# Patient Record
Sex: Female | Born: 1980 | Race: Black or African American | Hispanic: No | Marital: Single | Smoking: Former smoker
Health system: Southern US, Community
[De-identification: ages and names within clinical notes are randomized; demographics above are authoritative.]

## PROBLEM LIST (undated history)

## (undated) DIAGNOSIS — Z5189 Encounter for other specified aftercare: Secondary | ICD-10-CM

## (undated) DIAGNOSIS — R519 Headache, unspecified: Secondary | ICD-10-CM

## (undated) DIAGNOSIS — I10 Essential (primary) hypertension: Secondary | ICD-10-CM

## (undated) DIAGNOSIS — I639 Cerebral infarction, unspecified: Secondary | ICD-10-CM

## (undated) HISTORY — DX: Headache, unspecified: R51.9

## (undated) HISTORY — DX: Encounter for other specified aftercare: Z51.89

## (undated) HISTORY — DX: Essential (primary) hypertension: I10

## (undated) HISTORY — DX: Cerebral infarction, unspecified: I63.9

---

## 2006-11-06 ENCOUNTER — Inpatient Hospital Stay (HOSPITAL_COMMUNITY): Admission: AD | Admit: 2006-11-06 | Discharge: 2006-11-06 | Payer: Self-pay | Admitting: Obstetrics & Gynecology

## 2006-12-23 ENCOUNTER — Ambulatory Visit (HOSPITAL_COMMUNITY): Admission: RE | Admit: 2006-12-23 | Discharge: 2006-12-23 | Payer: Self-pay | Admitting: Obstetrics

## 2007-03-21 ENCOUNTER — Inpatient Hospital Stay (HOSPITAL_COMMUNITY): Admission: AD | Admit: 2007-03-21 | Discharge: 2007-03-21 | Payer: Self-pay | Admitting: Obstetrics & Gynecology

## 2007-03-30 ENCOUNTER — Inpatient Hospital Stay (HOSPITAL_COMMUNITY): Admission: AD | Admit: 2007-03-30 | Discharge: 2007-03-30 | Payer: Self-pay | Admitting: Obstetrics & Gynecology

## 2007-04-03 ENCOUNTER — Inpatient Hospital Stay (HOSPITAL_COMMUNITY): Admission: AD | Admit: 2007-04-03 | Discharge: 2007-04-03 | Payer: Self-pay | Admitting: Obstetrics

## 2007-04-04 ENCOUNTER — Inpatient Hospital Stay (HOSPITAL_COMMUNITY): Admission: AD | Admit: 2007-04-04 | Discharge: 2007-04-07 | Payer: Self-pay | Admitting: Obstetrics

## 2009-05-13 ENCOUNTER — Emergency Department (HOSPITAL_COMMUNITY): Admission: EM | Admit: 2009-05-13 | Discharge: 2009-05-13 | Payer: Self-pay | Admitting: Emergency Medicine

## 2009-05-17 ENCOUNTER — Inpatient Hospital Stay (HOSPITAL_COMMUNITY): Admission: AD | Admit: 2009-05-17 | Discharge: 2009-05-18 | Payer: Self-pay | Admitting: Obstetrics & Gynecology

## 2010-04-29 ENCOUNTER — Ambulatory Visit (HOSPITAL_COMMUNITY): Admission: RE | Admit: 2010-04-29 | Discharge: 2010-04-29 | Payer: Self-pay | Admitting: Obstetrics

## 2010-06-06 ENCOUNTER — Inpatient Hospital Stay (HOSPITAL_COMMUNITY): Admission: AD | Admit: 2010-06-06 | Discharge: 2010-06-06 | Payer: Self-pay | Admitting: Obstetrics & Gynecology

## 2010-06-19 ENCOUNTER — Ambulatory Visit: Payer: Self-pay | Admitting: Nurse Practitioner

## 2010-06-19 ENCOUNTER — Inpatient Hospital Stay (HOSPITAL_COMMUNITY): Admission: AD | Admit: 2010-06-19 | Discharge: 2010-06-22 | Payer: Self-pay | Admitting: Obstetrics & Gynecology

## 2010-06-26 ENCOUNTER — Inpatient Hospital Stay (HOSPITAL_COMMUNITY): Admission: AD | Admit: 2010-06-26 | Discharge: 2010-06-29 | Payer: Self-pay | Admitting: Obstetrics & Gynecology

## 2010-06-27 ENCOUNTER — Encounter: Payer: Self-pay | Admitting: Obstetrics & Gynecology

## 2010-08-08 ENCOUNTER — Inpatient Hospital Stay (HOSPITAL_COMMUNITY): Admission: AD | Admit: 2010-08-08 | Discharge: 2010-08-09 | Payer: Self-pay | Admitting: Obstetrics

## 2010-09-02 ENCOUNTER — Ambulatory Visit: Payer: Self-pay | Admitting: Family Medicine

## 2010-09-02 ENCOUNTER — Ambulatory Visit (HOSPITAL_COMMUNITY)
Admission: RE | Admit: 2010-09-02 | Discharge: 2010-09-02 | Payer: Self-pay | Source: Home / Self Care | Admitting: Family Medicine

## 2010-09-02 DIAGNOSIS — I10 Essential (primary) hypertension: Secondary | ICD-10-CM | POA: Insufficient documentation

## 2010-09-18 ENCOUNTER — Ambulatory Visit: Payer: Self-pay

## 2010-10-24 NOTE — Assessment & Plan Note (Signed)
Summary: np/referred from womens/eo   Vital Signs:  Patient profile:   30 year old female Weight:      163 pounds Temp:     98.9 degrees F oral Pulse rate:   80 / minute Pulse rhythm:   regular BP sitting:   145 / 89  (left arm) Cuff size:   regular  Vitals Entered By: Loralee Pacas CMA (September 02, 2010 2:16 PM)       History of Present Illness: this is a new pt with hypertension presenting to establish care.    she states she has had intermittent elevated blood pressure since the delivery of her first child in 2008.  since that time she has had intermittent hypertension and most notable during this most recent pregnancy in Oct 2011.  she is currently without any acute complaints today and is compliant with her medication.  she denies any headches, chest pain, difficulty breathing, edema, or any other symptoms.   pt is currently bottle feeding and using depo provera for contraception.   Past History:  Past Medical History: hypertension [id number] TSVD 2008 female, 6lbs 5 oz, +preeclampsia PTSVD at 36 wk due to PTL/PPROM, female, 6 lbs 12 oz  Family History: mom-hypertension, cholesterol, arthritis MGM-breast cancer  Social History: Single Former Smoker Drug use-no social drinker Smoking Status:  quit Drug Use:  no  Review of Systems  The patient denies anorexia, fever, weight loss, weight gain, vision loss, decreased hearing, hoarseness, chest pain, syncope, dyspnea on exertion, peripheral edema, prolonged cough, headaches, hemoptysis, abdominal pain, melena, hematochezia, severe indigestion/heartburn, hematuria, incontinence, genital sores, muscle weakness, suspicious skin lesions, transient blindness, difficulty walking, depression, unusual weight change, abnormal bleeding, enlarged lymph nodes, angioedema, breast masses, and testicular masses.    Physical Exam  General:  Well-developed,well-nourished,in no acute distress; alert,appropriate and cooperative  throughout examination Head:  Normocephalic and atraumatic without obvious abnormalities. No apparent alopecia or balding. Eyes:  No corneal or conjunctival inflammation noted. EOMI. Perrla. Funduscopic exam benign, without hemorrhages, exudates or papilledema. Vision grossly normal. Ears:  External ear exam shows no significant lesions or deformities.  Otoscopic examination reveals clear canals, tympanic membranes are intact bilaterally without bulging, retraction, inflammation or discharge. Hearing is grossly normal bilaterally. Mouth:  Oral mucosa and oropharynx without lesions or exudates.  Teeth in good repair. Neck:  No deformities, masses, or tenderness noted. Lungs:  Normal respiratory effort, chest expands symmetrically. Lungs are clear to auscultation, no crackles or wheezes. Heart:  Normal rate and regular rhythm. S1 and S2 normal without gallop, murmur, click, rub or other extra sounds. Abdomen:  Bowel sounds positive,abdomen soft and non-tender without masses, organomegaly or hernias noted. Pulses:  R and L carotid,radial,femoral,dorsalis pedis and posterior tibial pulses are full and equal bilaterally Extremities:  No clubbing, cyanosis, edema, or deformity noted with normal full range of motion of all joints.   Neurologic:  No cranial nerve deficits noted. Station and gait are normal. Plantar reflexes are down-going bilaterally. DTRs are symmetrical throughout. Sensory, motor and coordinative functions appear intact.   Impression & Recommendations:  Problem # 1:  ESSENTIAL HYPERTENSION, BENIGN (ICD-401.1) Assessment Unchanged  Her updated medication list for this problem includes:    Labetalol Hcl 300 Mg Tabs (Labetalol hcl) ..... One tab by mouth two times a day for hypertension    Hydrochlorothiazide 25 Mg Tabs (Hydrochlorothiazide) ..... One tab by mouth daily for hypertension    Norvasc 10 Mg Tabs (Amlodipine besylate) ..... One tab by mouth daily for hypertension  Future  Orders: UA Microalbumin-[facility] ([id number]) ... 09/04/2010 Comp Met-[facility] ([id number]) ... 09/04/2010 Lipid-[facility] ([id number]) ... 09/04/2010 TSH  initially pt on norvasc 5.  increased norvac to 10 mg daily.  check EKG today and is wnl.  awaiting fasting labs.  will have pt to return in 2 weeks for BP check.   TSH-[facility] [phone]) ... 09/04/2010  Complete Medication List: 1)  Labetalol Hcl 300 Mg Tabs (Labetalol hcl) .... One tab by mouth two times a day for hypertension 2)  Hydrochlorothiazide 25 Mg Tabs (Hydrochlorothiazide) .... One tab by mouth daily for hypertension 3)  Norvasc 10 Mg Tabs (Amlodipine besylate) .... One tab by mouth daily for hypertension  Patient Instructions: 1)  It was a pleasure to care for you today.  2)  Please schedule a follow-up appointment in 2 weeks. 3)  please obtain fasting lab work prior to next appointment.  4)  go to the ED if with chest pain, difficulty breathing, worsening headache, or any other concerning symptom.  Prescriptions: NORVASC 10 MG TABS (AMLODIPINE BESYLATE) one tab by mouth daily for hypertension  #30 x 0   Entered and Authorized by:   Maryelizabeth Kaufmann MD   Signed by:   Maryelizabeth Kaufmann MD on 09/02/2010   Method used:   Print then Give to Patient   RxID:   [id number]    Orders Added: 1)  UA Microalbumin-[facility] [[id number]] 2)  Comp Met-[facility] [[id number]] 3)  Lipid-[facility] [[id number]] 4)  TSH-[facility] [[id number]] 5)  [facility] [[id number]]

## 2010-12-03 LAB — COMPREHENSIVE METABOLIC PANEL
ALT: 16 U/L (ref 0–35)
AST: 17 U/L (ref 0–37)
Albumin: 4.1 g/dL (ref 3.5–5.2)
Alkaline Phosphatase: 64 U/L (ref 39–117)
CO2: 26 mEq/L (ref 19–32)
Chloride: 106 mEq/L (ref 96–112)
GFR calc Af Amer: >60 mL/min (ref >60)
GFR calc non Af Amer: >60 mL/min (ref >60)
Glucose, Bld: 86 mg/dL (ref 70–99)
Potassium: 4.1 mEq/L (ref 3.5–5.1)
Total Protein: 7.4 g/dL (ref 6.0–8.3)

## 2010-12-03 LAB — URIC ACID: Uric Acid, Serum: 5.3 mg/dL (ref 2.4–7.0)

## 2010-12-03 LAB — CBC
HCT: 37.5 % (ref 36.0–46.0)
Hemoglobin: 12.7 g/dL (ref 12.0–15.0)
WBC: 5.1 10*3/uL (ref 4.0–10.5)

## 2010-12-03 LAB — DIFFERENTIAL
Eosinophils Absolute: 0.1 10*3/uL (ref 0.0–0.7)
Eosinophils Relative: 2 % (ref 0–5)
Lymphocytes Relative: 40 % (ref 12–46)
Monocytes Absolute: 0.4 10*3/uL (ref 0.1–1.0)
Neutro Abs: 2.5 10*3/uL (ref 1.7–7.7)
Neutrophils Relative %: 49 % (ref 43–77)

## 2010-12-05 LAB — CBC
HCT: 29.2 % — ABNORMAL LOW (ref 36.0–46.0)
Hemoglobin: 10 g/dL — ABNORMAL LOW (ref 12.0–15.0)
Hemoglobin: 10.5 g/dL — ABNORMAL LOW (ref 12.0–15.0)
Hemoglobin: 10.6 g/dL — ABNORMAL LOW (ref 12.0–15.0)
MCH: 32.7 pg (ref 26.0–34.0)
MCH: 32.8 pg (ref 26.0–34.0)
MCHC: 33.9 g/dL (ref 30.0–36.0)
MCHC: 34.2 g/dL (ref 30.0–36.0)
MCV: 96.7 fL (ref 78.0–100.0)
Platelets: 244 10*3/uL (ref 150–400)
Platelets: 273 10*3/uL (ref 150–400)
RDW: 13.2 % (ref 11.5–15.5)
WBC: 6.8 10*3/uL (ref 4.0–10.5)
WBC: 8.4 10*3/uL (ref 4.0–10.5)

## 2010-12-05 LAB — CREATININE CLEARANCE, URINE, 24 HOUR
Creatinine Clearance: 170 mL/min — ABNORMAL HIGH (ref 75–115)
Creatinine, Urine: 46.4 mg/dL
Creatinine: 0.55 mg/dL (ref 0.4–1.2)

## 2010-12-05 LAB — COMPREHENSIVE METABOLIC PANEL
AST: 23 U/L (ref 0–37)
Albumin: 2.8 g/dL — ABNORMAL LOW (ref 3.5–5.2)
Albumin: 3 g/dL — ABNORMAL LOW (ref 3.5–5.2)
Alkaline Phosphatase: 143 U/L — ABNORMAL HIGH (ref 39–117)
BUN: 3 mg/dL — ABNORMAL LOW (ref 6–23)
CO2: 24 mEq/L (ref 19–32)
Calcium: 9.6 mg/dL (ref 8.4–10.5)
Chloride: 106 mEq/L (ref 96–112)
Creatinine, Ser: 0.55 mg/dL (ref 0.4–1.2)
GFR calc non Af Amer: >60 mL/min (ref >60)
Glucose, Bld: 83 mg/dL (ref 70–99)
Glucose, Bld: 93 mg/dL (ref 70–99)
Potassium: 4.6 mEq/L (ref 3.5–5.1)
Sodium: 135 mEq/L (ref 135–145)
Total Bilirubin: 0.5 mg/dL (ref 0.3–1.2)
Total Bilirubin: 0.6 mg/dL (ref 0.3–1.2)

## 2010-12-05 LAB — URIC ACID
Uric Acid, Serum: 4.8 mg/dL (ref 2.4–7.0)
Uric Acid, Serum: 5.1 mg/dL (ref 2.4–7.0)

## 2010-12-05 LAB — URINALYSIS, ROUTINE W REFLEX MICROSCOPIC
Bilirubin Urine: NEGATIVE
Nitrite: NEGATIVE
Protein, ur: NEGATIVE mg/dL

## 2010-12-05 LAB — STREP B DNA PROBE: Strep Group B Ag: NEGATIVE

## 2010-12-05 LAB — URINE MICROSCOPIC-ADD ON

## 2010-12-05 LAB — PROTEIN, URINE, 24 HOUR: Urine Total Volume-UPROT: 2900 mL

## 2010-12-28 LAB — COMPREHENSIVE METABOLIC PANEL
ALT: 14 U/L (ref 0–35)
AST: 18 U/L (ref 0–37)
Calcium: 9.9 mg/dL (ref 8.4–10.5)
GFR calc Af Amer: >60 mL/min (ref >60)
Glucose, Bld: 88 mg/dL (ref 70–99)
Sodium: 136 mEq/L (ref 135–145)
Total Protein: 7.6 g/dL (ref 6.0–8.3)

## 2010-12-28 LAB — URINE MICROSCOPIC-ADD ON: RBC / HPF: NONE SEEN RBC/hpf (ref <3)

## 2010-12-28 LAB — WET PREP, GENITAL
Clue Cells Wet Prep HPF POC: NONE SEEN
Trich, Wet Prep: NONE SEEN

## 2010-12-28 LAB — URINALYSIS, ROUTINE W REFLEX MICROSCOPIC
Bilirubin Urine: NEGATIVE
Glucose, UA: NEGATIVE mg/dL
Ketones, ur: NEGATIVE mg/dL
pH: 6 (ref 5.0–8.0)

## 2010-12-28 LAB — CBC
MCHC: 33.3 g/dL (ref 30.0–36.0)
MCV: 94.8 fL (ref 78.0–100.0)
RBC: 3.73 MIL/uL — ABNORMAL LOW (ref 3.87–5.11)

## 2010-12-28 LAB — POCT PREGNANCY, URINE: Preg Test, Ur: POSITIVE

## 2010-12-28 LAB — LACTATE DEHYDROGENASE: LDH: 101 U/L (ref 94–250)

## 2010-12-28 LAB — GC/CHLAMYDIA PROBE AMP, GENITAL: Chlamydia, DNA Probe: POSITIVE — AB

## 2011-04-15 IMAGING — US US OB COMP LESS 14 WK
1 series · 14 of 25 positions shown · non-contrast
Comparison: None relevant.

Addendum Begins

Results were called to the maternity admissions unit at the time of
interpretation.
Addendum Ends
CLINICAL DATA: First trimester pregnancy.  Pelvic pain.  LMP
05/17/2009.  Quantitative beta HCG level unknown.
OBSTETRIC <14 WK ULTRASOUND
TECHNIQUE: Transabdominal ultrasound was performed for evaluation
of the gestation as well as the maternal uterus and adnexal
regions.

[Series 1: us ob comp less 14 wks · 0.19mm/px · 25 acquisitions, 14 frames shown]
[im 1/25]
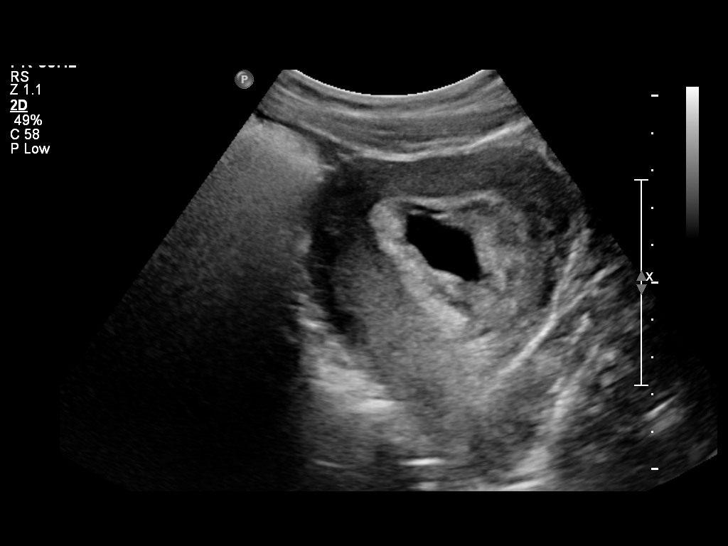
[im 3/25]
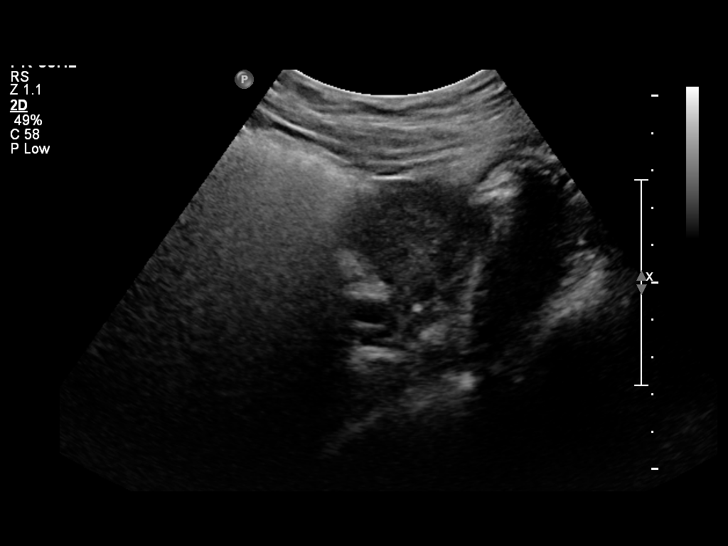
[im 5/25]
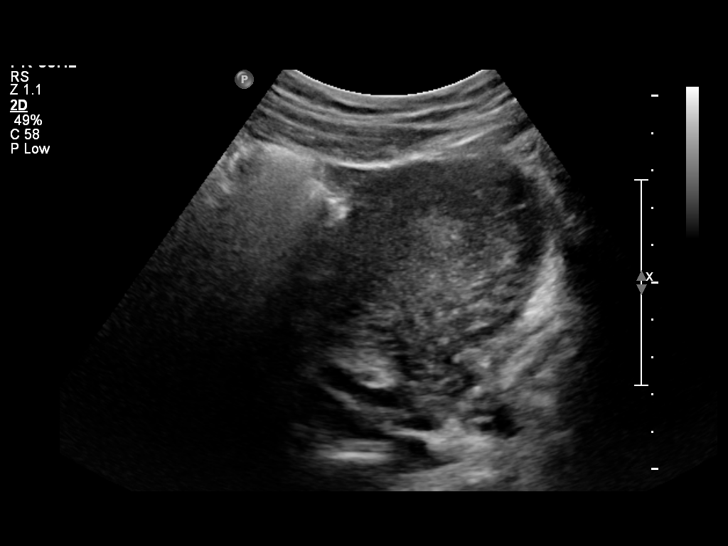
[im 7/25]
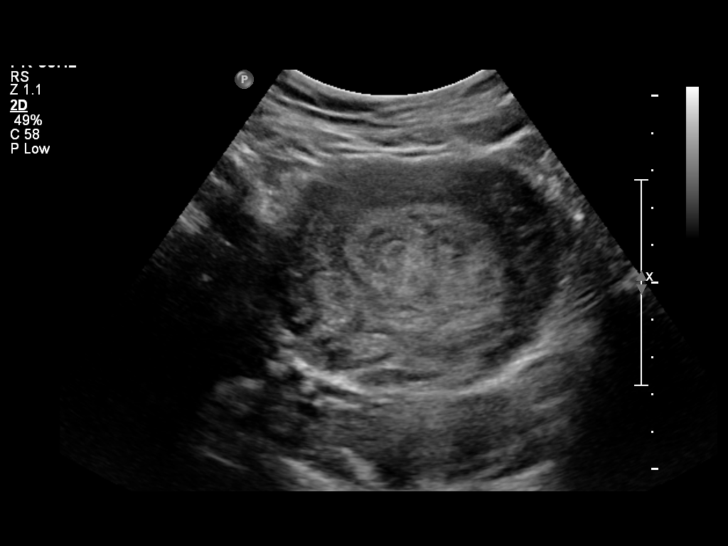
[im 9/25]
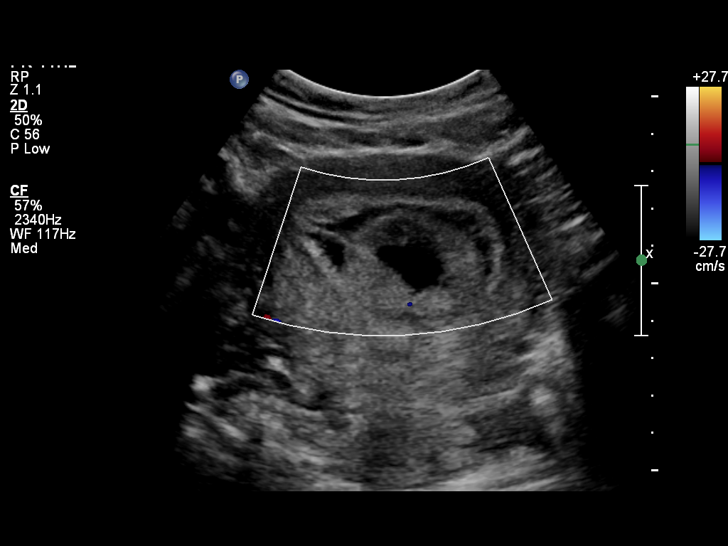
[im 10/25]
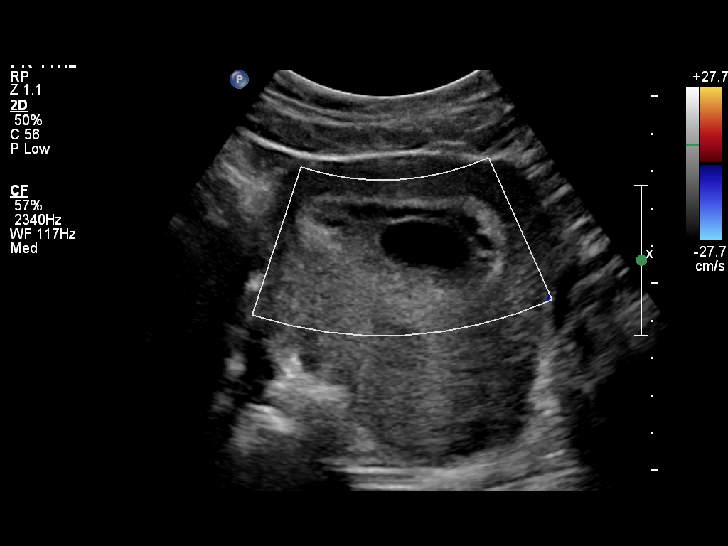
[im 12/25]
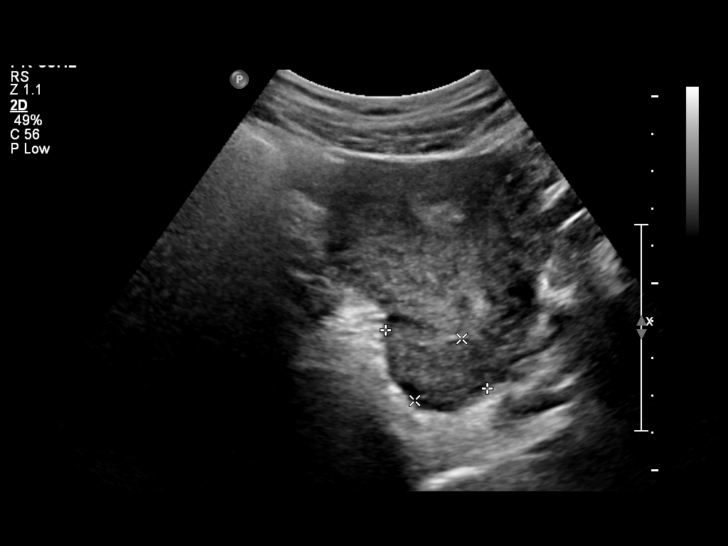
[im 14/25]
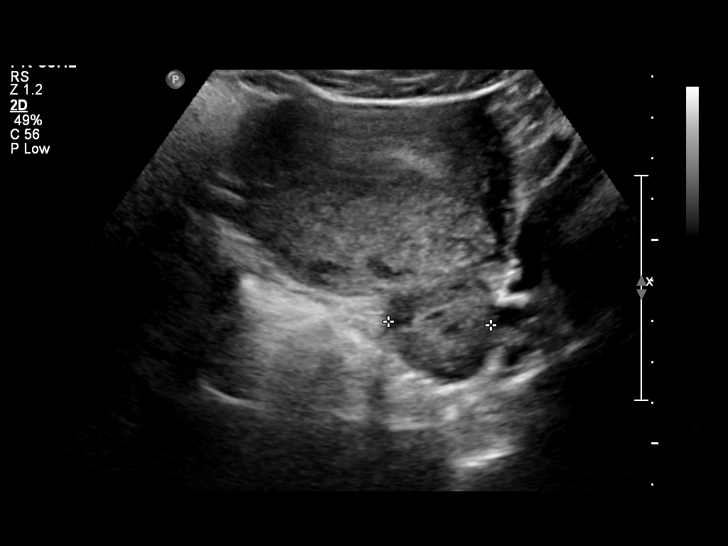
[im 16/25]
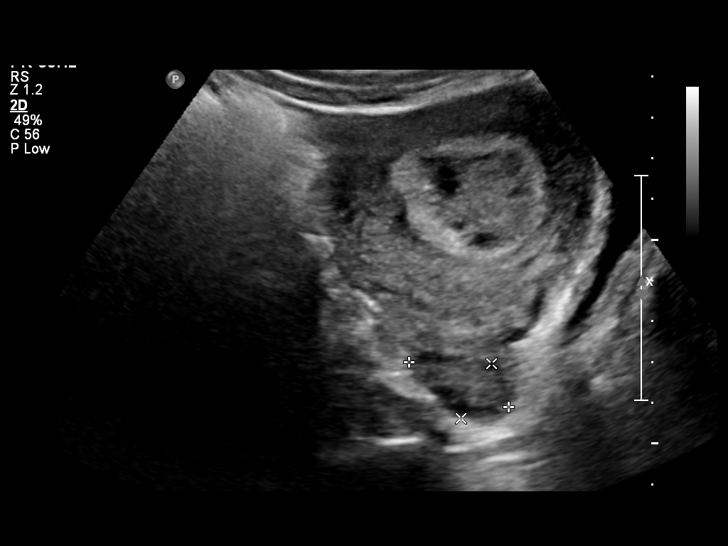
[im 17/25]
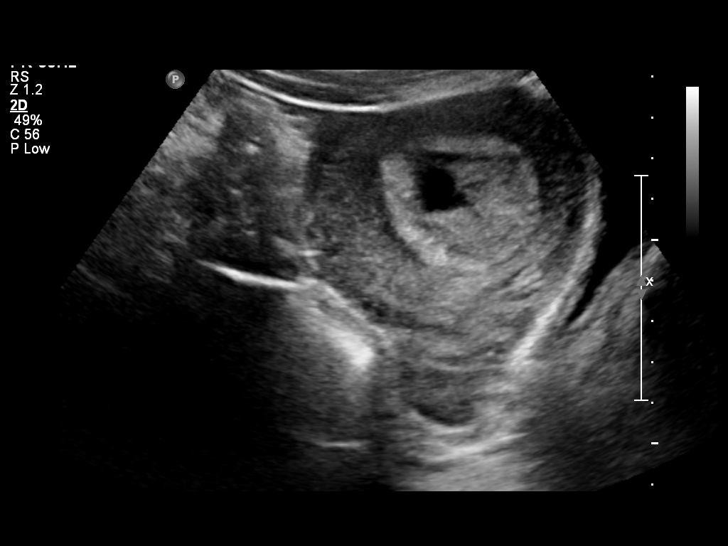
[im 19/25]
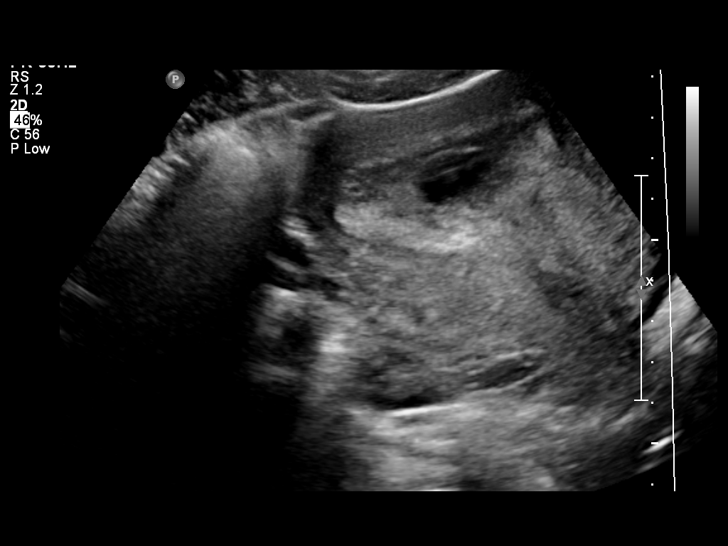
[im 21/25]
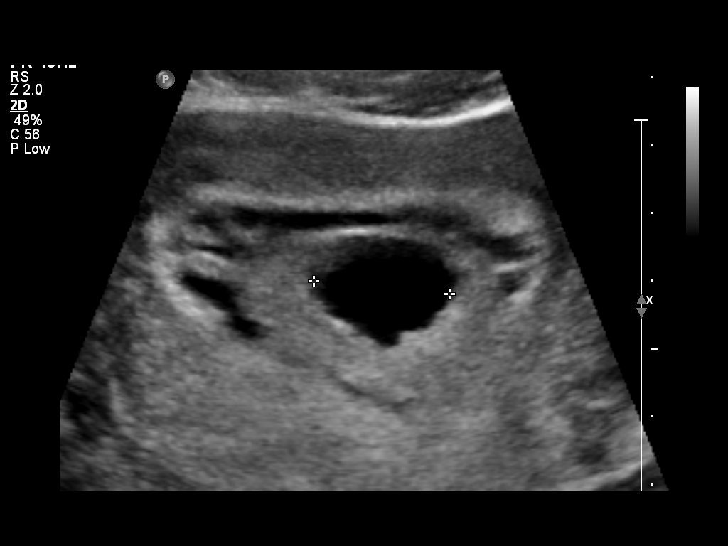
[im 23/25]
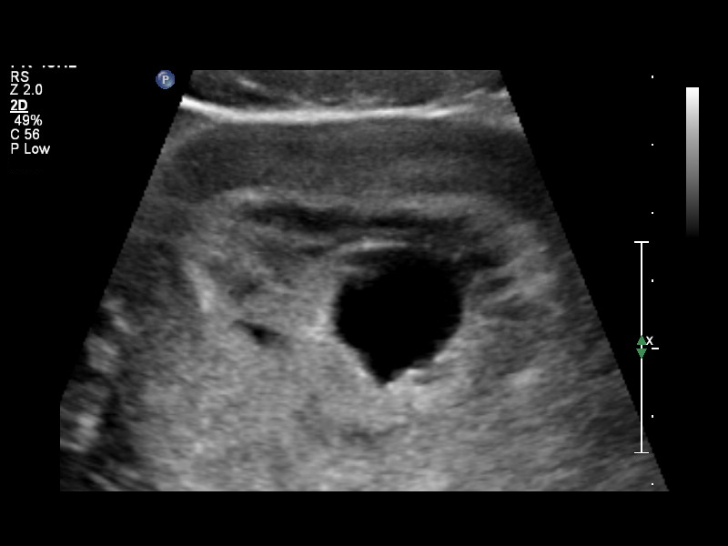
[im 25/25]
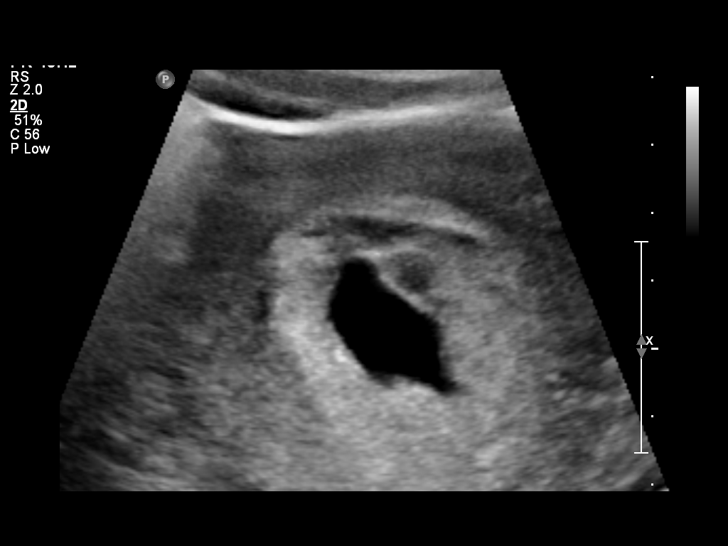

[14 of 25 positions shown; findings below may reference images not displayed]

Intrauterine gestational sac: Single misshapen gestational sac is
identified.
Yolk sac: Not visualized
Embryo: Not visualized

MSD: 19.9 mm  7w  0d
US EDC: 01/03/2010

Maternal uterus/Adnexae:
Small to moderate subchorionic hematoma is present.  Both maternal
ovaries appear normal.  There is no significant free pelvic fluid
or adnexal mass.
IMPRESSION: 1.  Misshapen intrauterine gestational sac corresponds with a
gestational age of 7 weeks 0 days.
2.  No yolk sac or embryo is identified and both should be visible
at this stage in normal pregnancy.  Findings are compatible with an
abortion in progress or an anembryonic pregnancy.
3.  No adnexal mass or significant free pelvic fluid.

## 2011-07-07 LAB — CBC
MCHC: 34.6
MCV: 98.7
Platelets: 204
RDW: 14.3 — ABNORMAL HIGH
WBC: 10.9 — ABNORMAL HIGH

## 2011-07-07 LAB — RPR: RPR Ser Ql: NONREACTIVE

## 2011-07-08 LAB — COMPREHENSIVE METABOLIC PANEL
ALT: 12
AST: 27
Albumin: 2.8 — ABNORMAL LOW
Alkaline Phosphatase: 106
Alkaline Phosphatase: 120 — ABNORMAL HIGH
BUN: 7
CO2: 22
Calcium: 9.3
GFR calc Af Amer: >60
GFR calc non Af Amer: >60
Glucose, Bld: 93
Potassium: 3.9
Potassium: 4.3
Sodium: 133 — ABNORMAL LOW
Total Protein: 6.2
Total Protein: 6.2

## 2011-07-08 LAB — CBC
HCT: 34.6 — ABNORMAL LOW
Hemoglobin: 10.2 — ABNORMAL LOW
Hemoglobin: 11.8 — ABNORMAL LOW
Hemoglobin: 11.9 — ABNORMAL LOW
MCHC: 34.1
Platelets: 249
RBC: 3.53 — ABNORMAL LOW
RDW: 13.8
RDW: 13.9
RDW: 14

## 2011-07-08 LAB — LACTATE DEHYDROGENASE: LDH: 107

## 2011-07-08 LAB — URIC ACID: Uric Acid, Serum: 4.9

## 2011-07-09 LAB — CBC
HCT: 36.7
MCHC: 34.5
MCV: 96.4
Platelets: 274
RDW: 13.4
WBC: 7.6

## 2011-07-09 LAB — COMPREHENSIVE METABOLIC PANEL
Albumin: 2.9 — ABNORMAL LOW
BUN: 7
Calcium: 9.3
Chloride: 104
Creatinine, Ser: 0.73
Total Bilirubin: 0.5

## 2011-07-09 LAB — URINALYSIS, ROUTINE W REFLEX MICROSCOPIC
Bilirubin Urine: NEGATIVE
Hgb urine dipstick: NEGATIVE
Ketones, ur: NEGATIVE
Nitrite: NEGATIVE
Protein, ur: NEGATIVE
Urobilinogen, UA: 0.2

## 2011-07-09 LAB — URIC ACID: Uric Acid, Serum: 5.1

## 2011-09-23 NOTE — L&D Delivery Note (Signed)
Delivery Note At 3:42 AM a viable female was delivered via Vaginal, Spontaneous Delivery (Presentation: Left Occiput Anterior).  APGAR: 8, 9; weight .   Placenta status: Intact, Spontaneous.  Cord: 3 vessels with the following complications: None.    Anesthesia: None  Episiotomy: None Lacerations: None Suture Repair: na Est. Blood Loss (mL): 350  Mom to postpartum.  Baby to nursery-stable.  Rulon Abide 06/09/2012, 4:09 AM

## 2011-09-23 NOTE — L&D Delivery Note (Signed)
I have seen and examined this patient and I agree with the above. Cam Hai 4:28 AM 06/09/2012

## 2012-03-27 IMAGING — US US OB DETAIL+14 WK
1 series · 14 of 28 positions shown · non-contrast
Comparison: none

OBSTETRICAL ULTRASOUND:
 This ultrasound exam was performed in the [HOSPITAL] Ultrasound Department.  The OB US report was generated in the AS system, and faxed to the ordering physician.  This report is also available in [HOSPITAL]?s AccessANYware and in [REDACTED] PACS.

[Series 1: us ob detail +14 wk · 46 acquisitions, 14 frames shown]
[im 2/46]
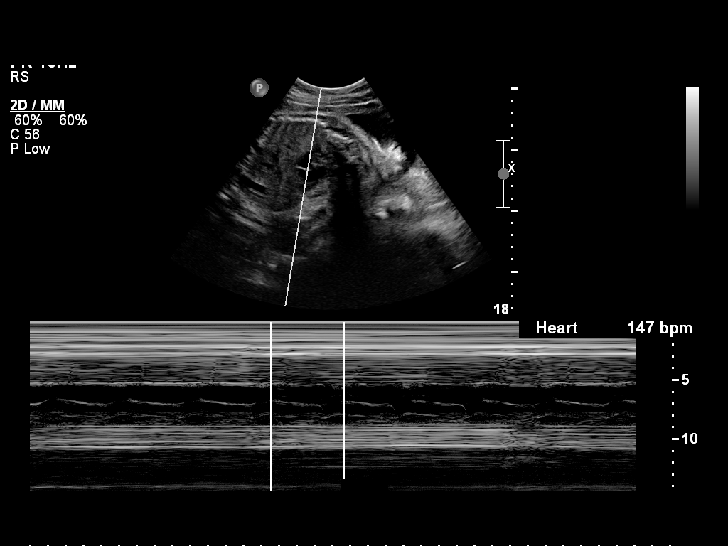
[im 6/46]
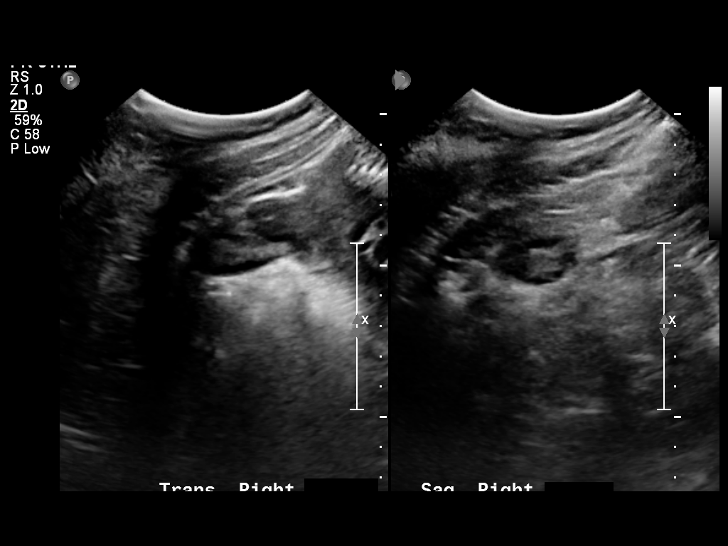
[im 9/46]
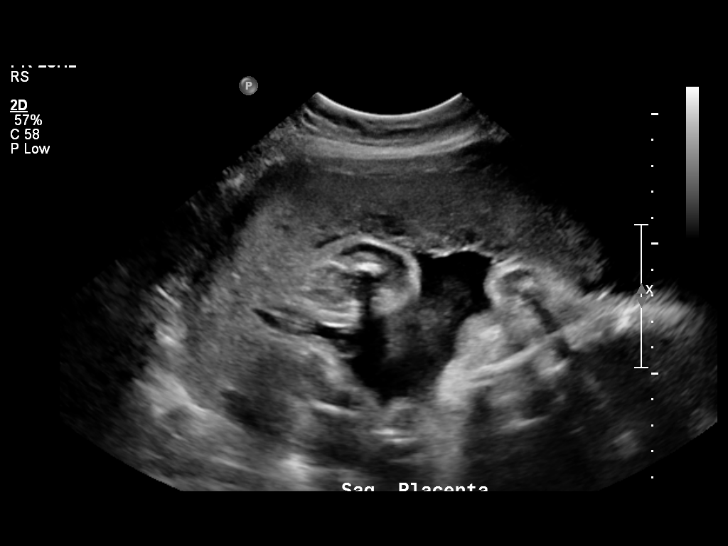
[im 12/46]
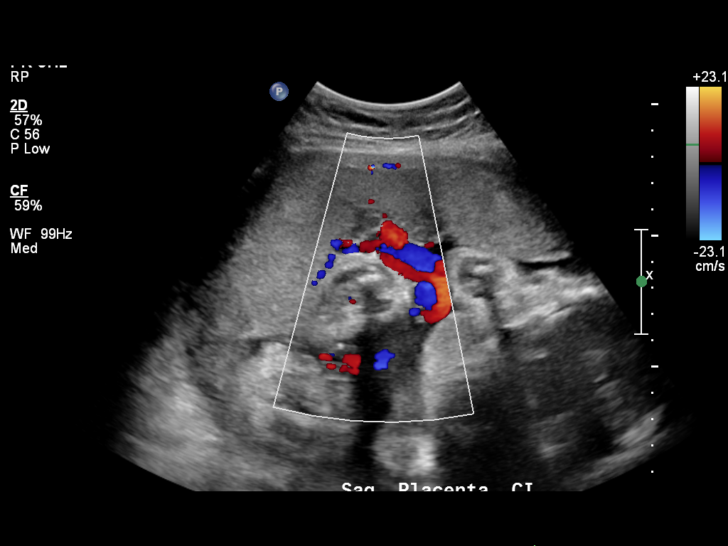
[im 16/46]
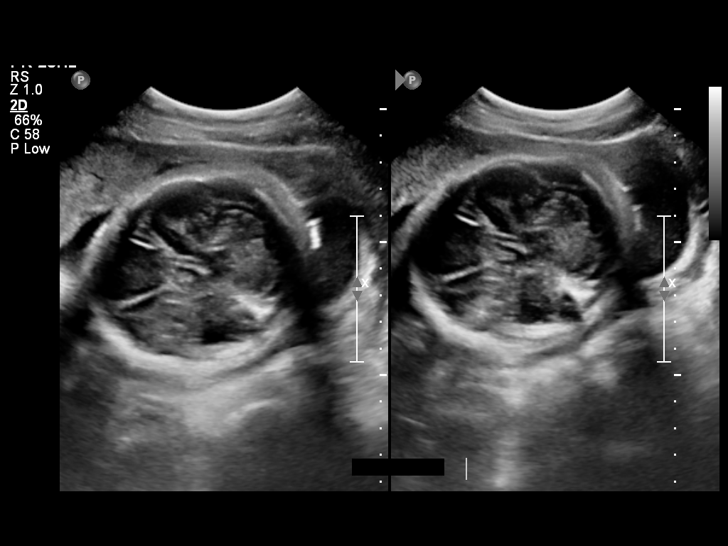
[im 19/46]
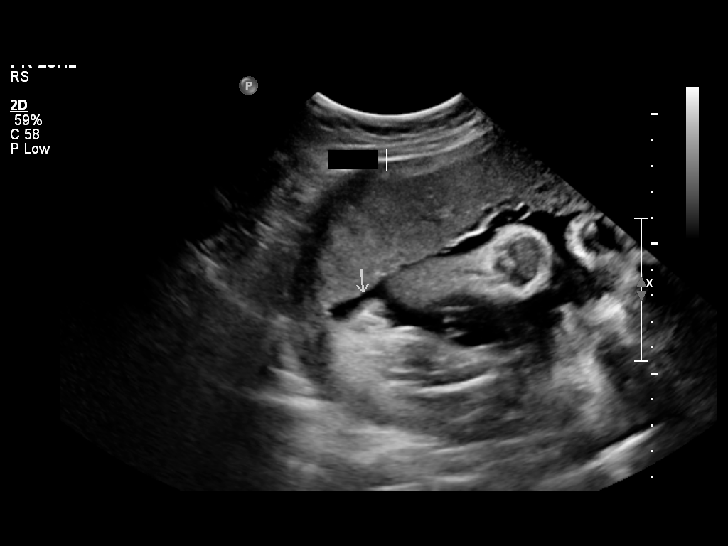
[im 22/46]
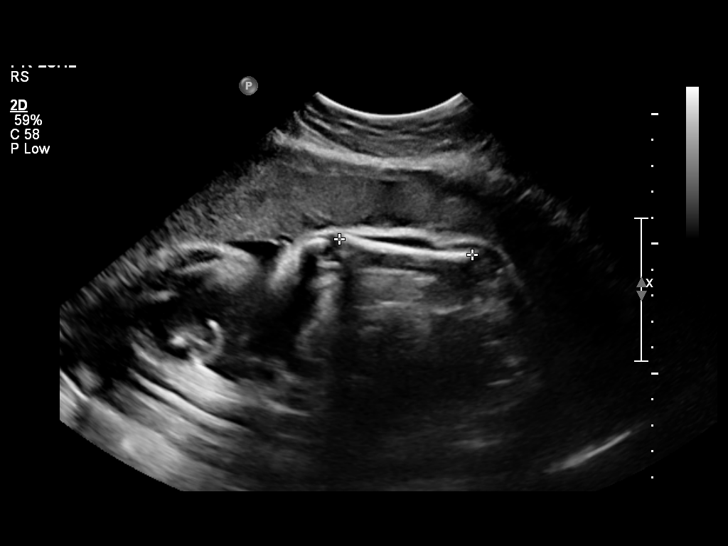
[im 26/46]
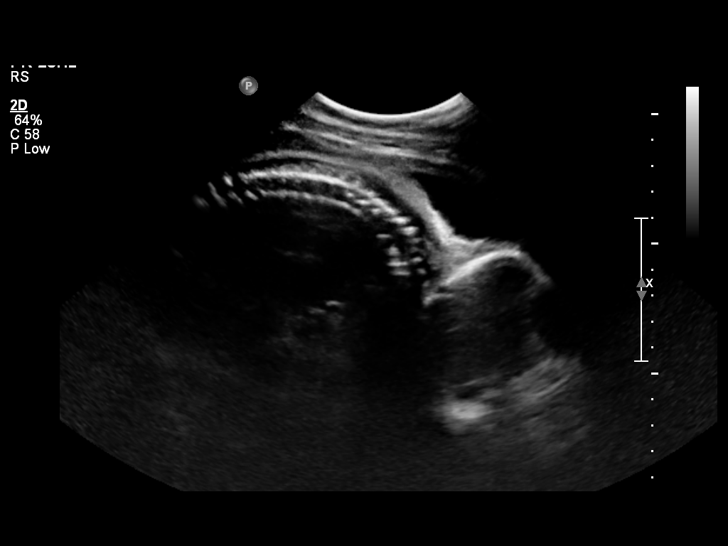
[im 29/46]
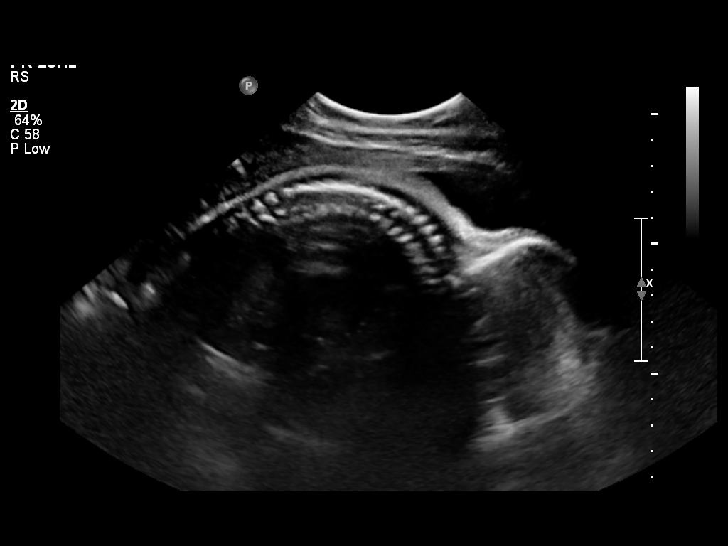
[im 32/46]
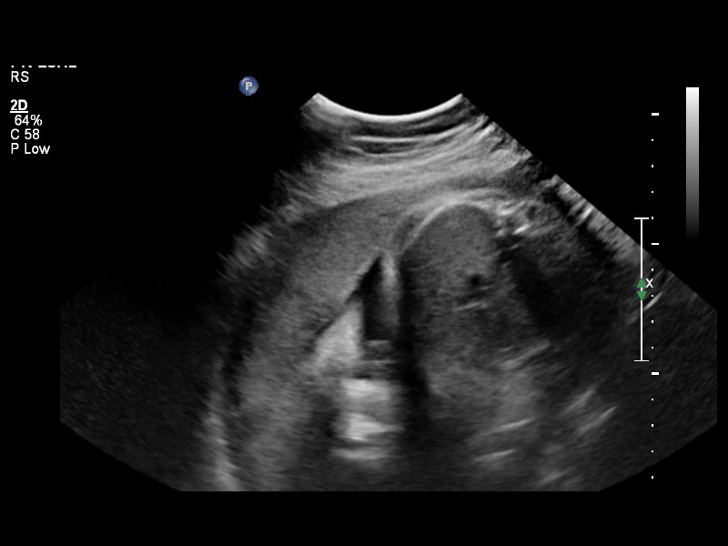
[im 36/46]
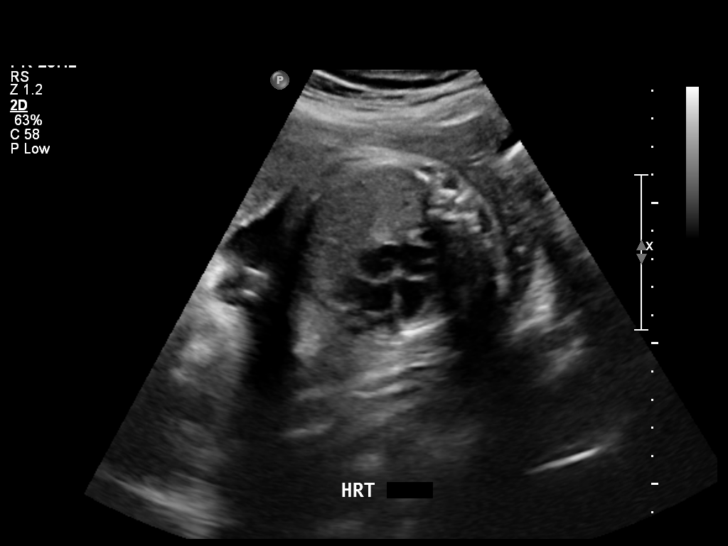
[im 39/46]
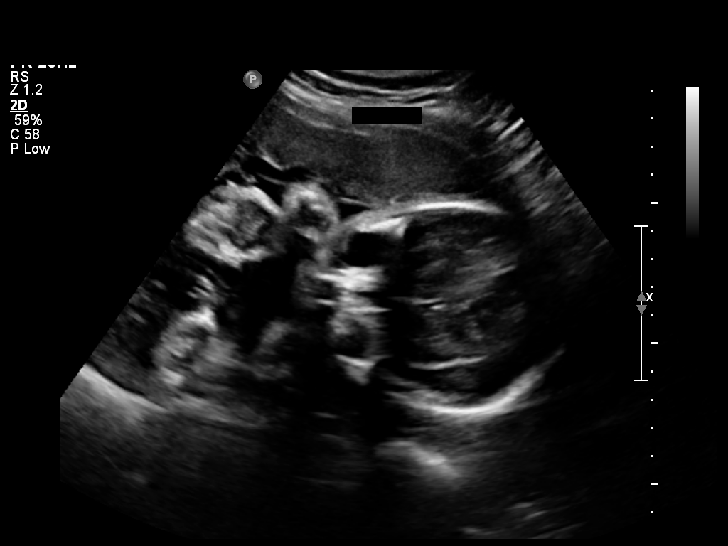
[im 42/46]
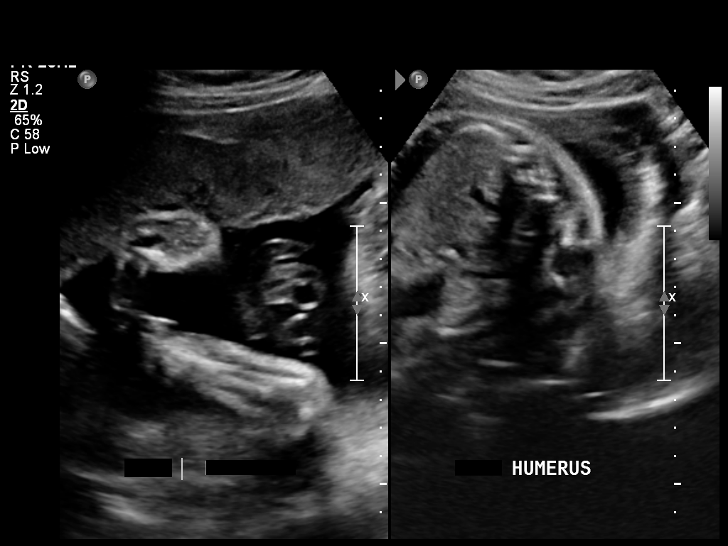
[im 46/46]
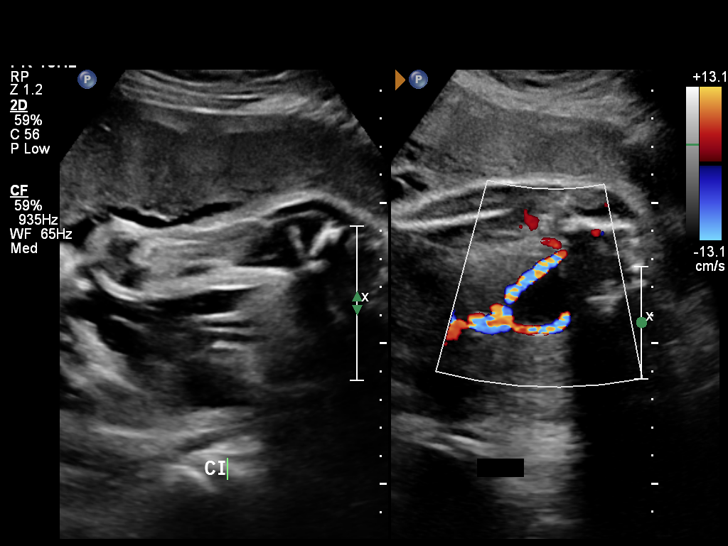

[14 of 28 positions shown; findings below may reference images not displayed]

IMPRESSION: See AS Obstetric US report.

## 2012-05-17 IMAGING — US US OB FOLLOW-UP
1 series · 14 of 28 positions shown · non-contrast
Comparison: none

OBSTETRICAL ULTRASOUND:
 This ultrasound exam was performed in the [HOSPITAL] Ultrasound Department.  The OB US report was generated in the AS system, and faxed to the ordering physician.  This report is also available in [HOSPITAL]?s Access[employer] and in [REDACTED] PACS.

[Series 1: us ob follow up · 14 of 33 slices shown]
[im 2/33]
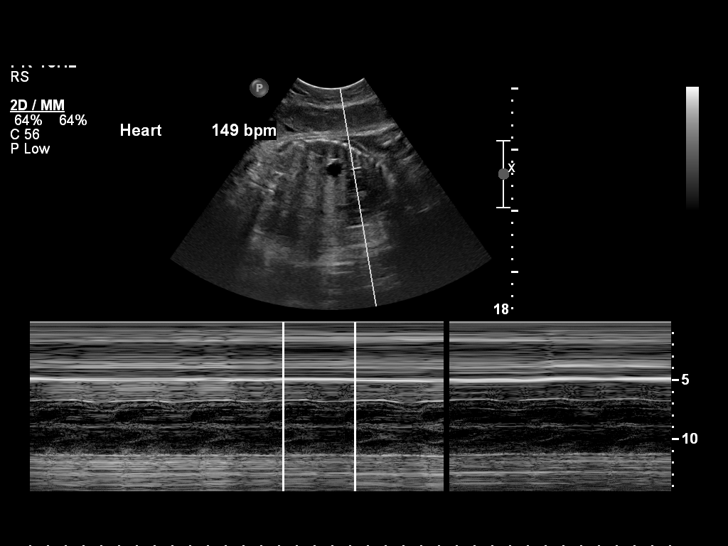
[im 4/33]
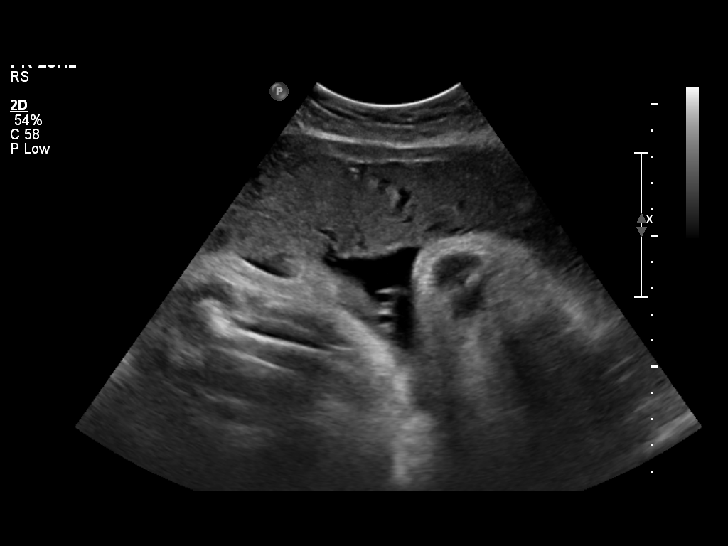
[im 6/33]
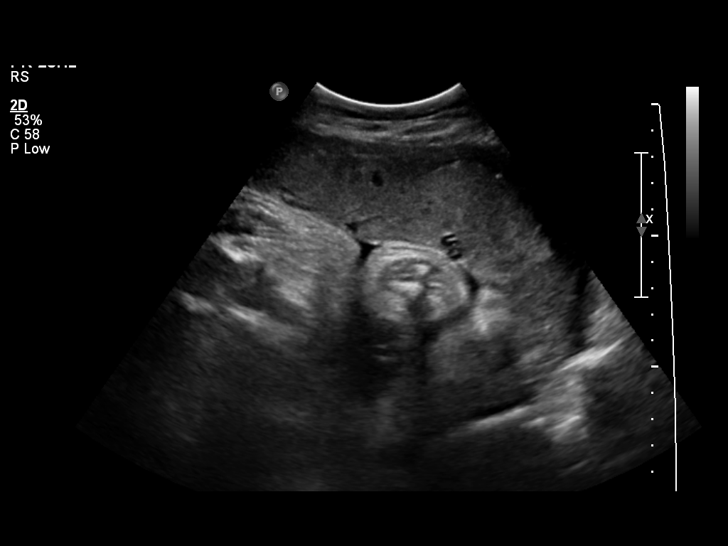
[im 9/33]
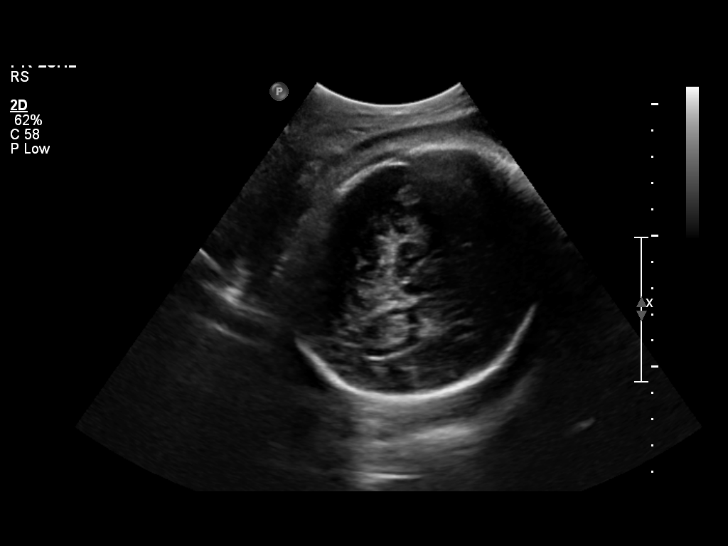
[im 11/33]
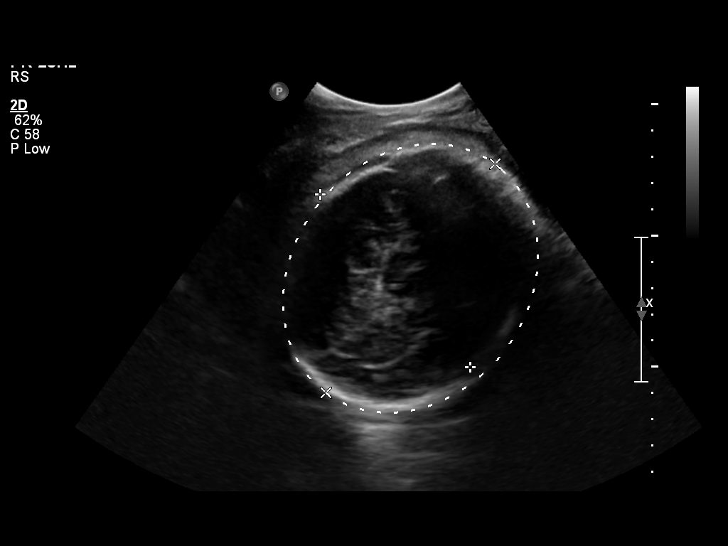
[im 14/33]
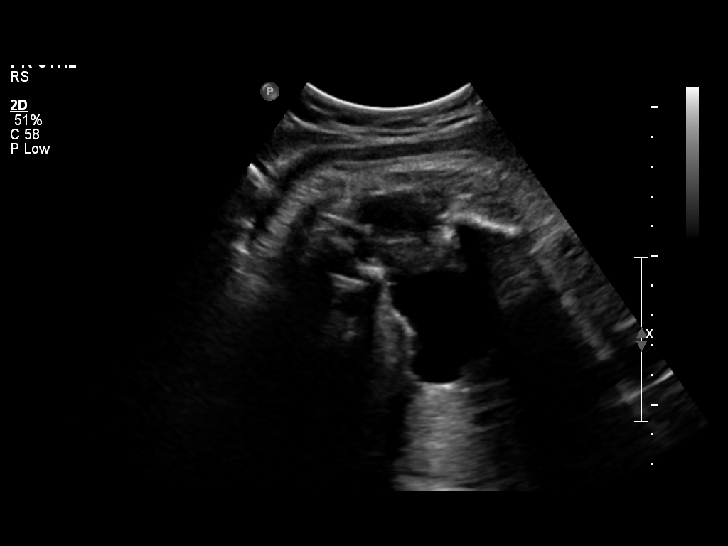
[im 16/33]
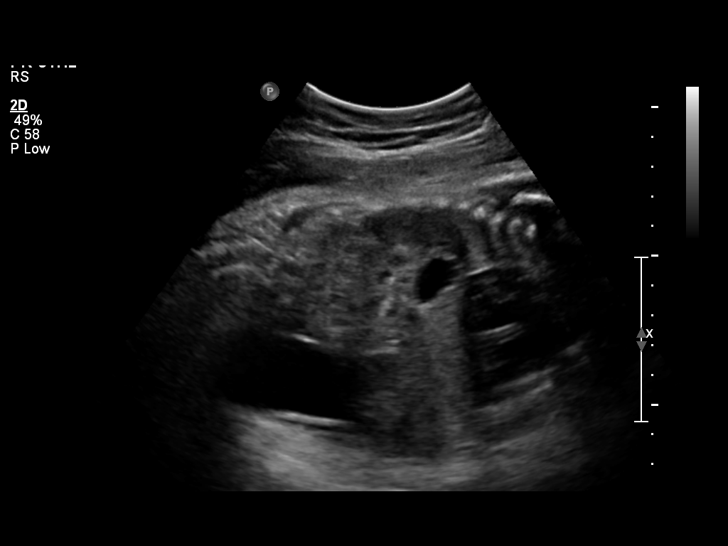
[im 18/33]
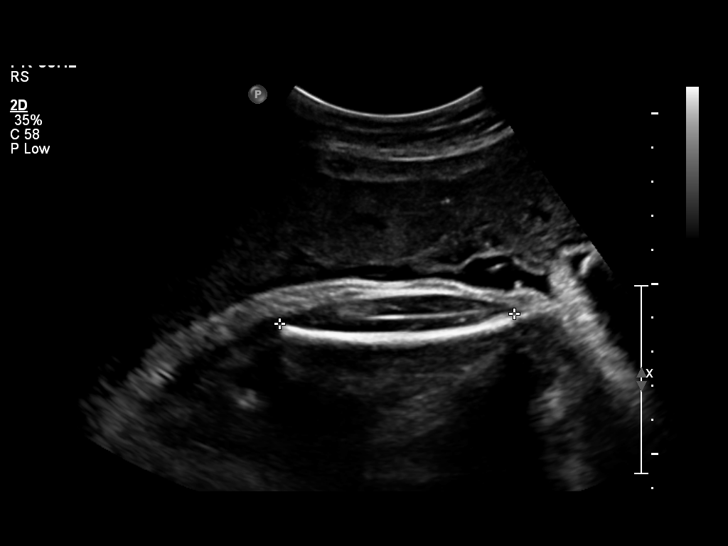
[im 21/33]
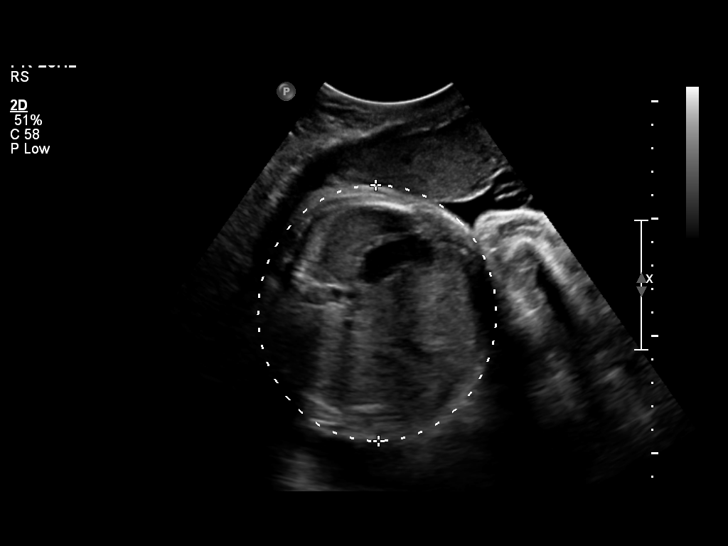
[im 23/33]
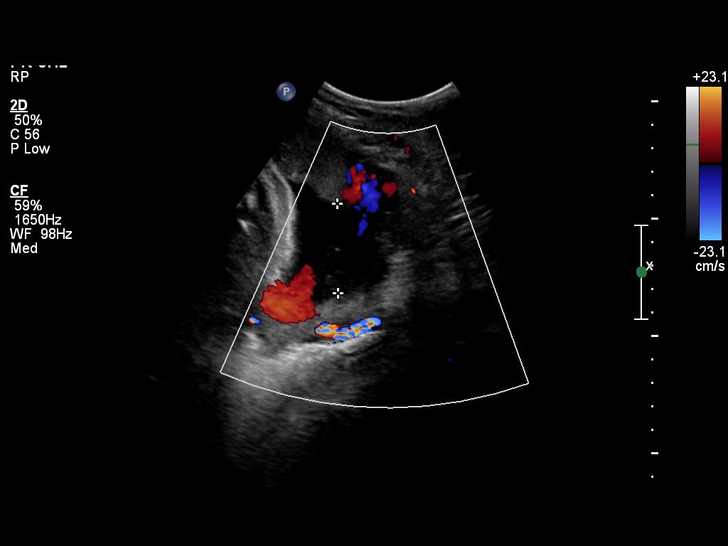
[im 25/33]
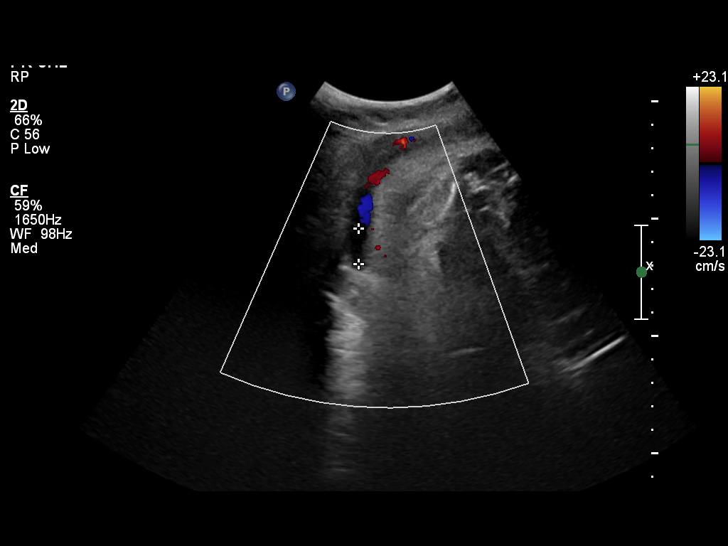
[im 28/33]
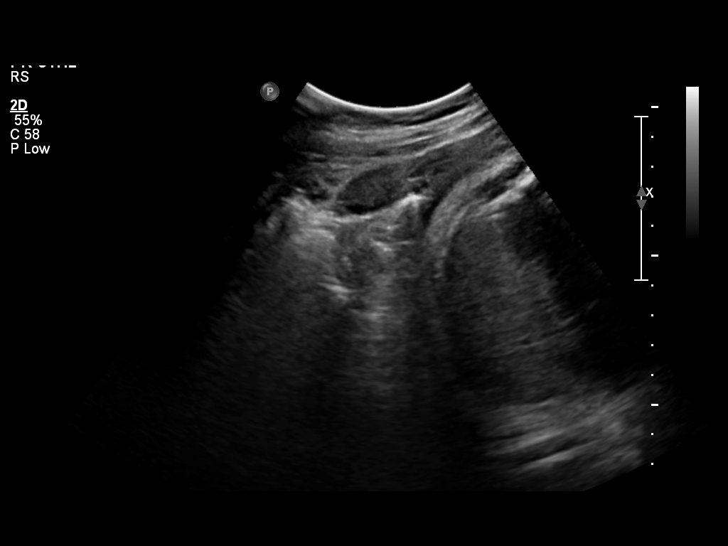
[im 30/33]
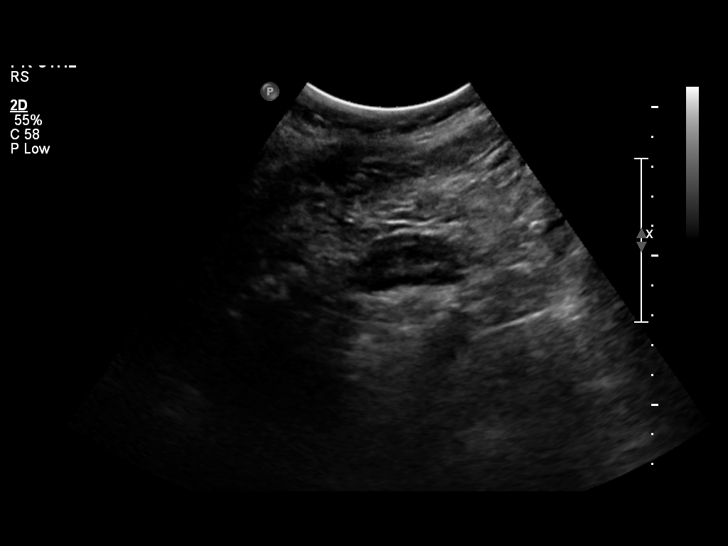
[im 33/33]
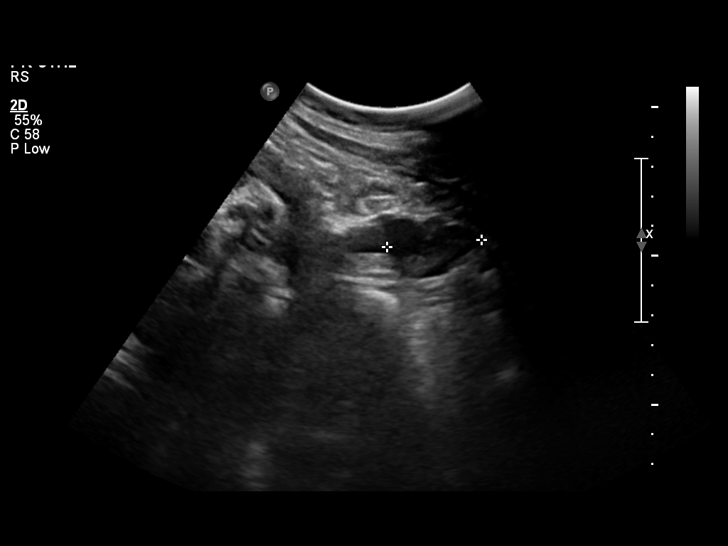

[14 of 28 positions shown; findings below may reference images not displayed]

IMPRESSION: See AS Obstetric US report.

## 2012-06-08 ENCOUNTER — Encounter (HOSPITAL_COMMUNITY): Payer: Self-pay | Admitting: *Deleted

## 2012-06-08 ENCOUNTER — Inpatient Hospital Stay (HOSPITAL_COMMUNITY)
Admission: AD | Admit: 2012-06-08 | Discharge: 2012-06-12 | DRG: 774 | Disposition: A | Discharge status: Home / Self Care | Payer: Medicaid Other | Source: Ambulatory Visit | Attending: Obstetrics & Gynecology | Admitting: Obstetrics & Gynecology

## 2012-06-08 DIAGNOSIS — O9903 Anemia complicating the puerperium: Secondary | ICD-10-CM | POA: Diagnosis not present

## 2012-06-08 DIAGNOSIS — O1002 Pre-existing essential hypertension complicating childbirth: Principal | ICD-10-CM | POA: Diagnosis present

## 2012-06-08 DIAGNOSIS — D649 Anemia, unspecified: Secondary | ICD-10-CM | POA: Diagnosis not present

## 2012-06-08 DIAGNOSIS — O0930 Supervision of pregnancy with insufficient antenatal care, unspecified trimester: Secondary | ICD-10-CM

## 2012-06-08 LAB — COMPREHENSIVE METABOLIC PANEL
AST: 20 U/L (ref 0–37)
BUN: 6 mg/dL (ref 6–23)
CO2: 20 mEq/L (ref 19–32)
Calcium: 9.4 mg/dL (ref 8.4–10.5)
Chloride: 100 mEq/L (ref 96–112)
Creatinine, Ser: 0.79 mg/dL (ref 0.50–1.10)
GFR calc non Af Amer: >90 mL/min (ref >90)
Total Bilirubin: 0.7 mg/dL (ref 0.3–1.2)

## 2012-06-08 LAB — CBC
Hemoglobin: 9.7 g/dL — ABNORMAL LOW (ref 12.0–15.0)
MCH: 29.8 pg (ref 26.0–34.0)
MCHC: 32.4 g/dL (ref 30.0–36.0)
MCV: 92 fL (ref 78.0–100.0)
Platelets: 183 10*3/uL (ref 150–400)

## 2012-06-08 MED ORDER — OXYTOCIN 40 UNITS IN LACTATED RINGERS INFUSION - SIMPLE MED
62.5000 mL/h | Freq: Once | INTRAVENOUS | Status: DC
  Filled 2012-06-08: qty 1000

## 2012-06-08 MED ORDER — FLEET ENEMA 7-19 GM/118ML RE ENEM: 1.0000 | ENEMA | RECTAL | Status: DC | PRN

## 2012-06-08 MED ORDER — LACTATED RINGERS IV SOLN: 500.0000 mL | INTRAVENOUS | Status: DC | PRN

## 2012-06-08 MED ORDER — OXYCODONE-ACETAMINOPHEN 5-325 MG PO TABS: 1.0000 | ORAL_TABLET | ORAL | Status: DC | PRN

## 2012-06-08 MED ORDER — IBUPROFEN 600 MG PO TABS: 600.0000 mg | ORAL_TABLET | Freq: Four times a day (QID) | ORAL | Status: DC | PRN

## 2012-06-08 MED ORDER — FENTANYL CITRATE 0.05 MG/ML IJ SOLN
100.0000 ug | Freq: Once | INTRAMUSCULAR | Status: AC
  Administered 2012-06-08: 100 ug via INTRAVENOUS
  Filled 2012-06-08: qty 2

## 2012-06-08 MED ORDER — LACTATED RINGERS IV SOLN
INTRAVENOUS | Status: DC
  Administered 2012-06-08: 23:00:00 via INTRAVENOUS

## 2012-06-08 MED ORDER — CITRIC ACID-SODIUM CITRATE 334-500 MG/5ML PO SOLN: 30.0000 mL | ORAL | Status: DC | PRN

## 2012-06-08 MED ORDER — ACETAMINOPHEN 325 MG PO TABS: 650.0000 mg | ORAL_TABLET | ORAL | Status: DC | PRN

## 2012-06-08 MED ORDER — OXYTOCIN BOLUS FROM INFUSION
500.0000 mL | Freq: Once | INTRAVENOUS | Status: DC
  Filled 2012-06-08: qty 500

## 2012-06-08 MED ORDER — SODIUM CHLORIDE 0.9 % IV SOLN
2.0000 g | Freq: Once | INTRAVENOUS | Status: AC
  Administered 2012-06-08: 2 g via INTRAVENOUS
  Filled 2012-06-08: qty 2000

## 2012-06-08 MED ORDER — LABETALOL HCL 5 MG/ML IV SOLN
20.0000 mg | Freq: Once | INTRAVENOUS | Status: AC
  Administered 2012-06-08: 20 mg via INTRAVENOUS
  Filled 2012-06-08: qty 4

## 2012-06-08 MED ORDER — MAGNESIUM SULFATE 40 G IN LACTATED RINGERS - SIMPLE
2.0000 g/h | INTRAVENOUS | Status: AC
  Administered 2012-06-08: 4 g/h via INTRAVENOUS
  Administered 2012-06-08 – 2012-06-09 (×2): 2 g/h via INTRAVENOUS
  Filled 2012-06-08 (×2): qty 500

## 2012-06-08 MED ORDER — LIDOCAINE HCL (PF) 1 % IJ SOLN
30.0000 mL | INTRAMUSCULAR | Status: DC | PRN
  Filled 2012-06-08: qty 30

## 2012-06-08 MED ORDER — ONDANSETRON HCL 4 MG/2ML IJ SOLN: 4.0000 mg | Freq: Four times a day (QID) | INTRAMUSCULAR | Status: DC | PRN

## 2012-06-08 MED ORDER — MAGNESIUM SULFATE BOLUS VIA INFUSION
4.0000 g | Freq: Once | INTRAVENOUS | Status: DC
  Filled 2012-06-08: qty 500

## 2012-06-08 NOTE — MAU Note (Signed)
Pt to room 1, uncomfortable. Dr Reola Calkins called to bedside, pt is G3P2 no prenatal care , thinks she is 38 weeks, SVE complete, +1, ok to transport per provider, FHR obtained. To room 165 per stretcher.

## 2012-06-08 NOTE — H&P (Signed)
Rebecca Andrews is a 31 y.o. female  G47P2002 with no prenatal care and relatively sure LMP of 09/29/11 making her [redacted]w[redacted]d today presenting for regular contractions since 4 pm today. Pt states they have become stronger and more intense.  NO LOF, VB.  +FM.   She denies HAs, RUQ pain, vision changes or worsening swelling.   Of note, pt states she always has high blood pressure in pregnancy and that she has needed mag with her other deliveries.    History OB History    Grav Para Term Preterm Abortions TAB SAB Ect Mult Living   3 2 2       2      No past medical history on file. No past surgical history on file. Family History: family history is not on file. Social History:  reports that she has been smoking.  She does not have any smokeless tobacco history on file. She reports that she does not drink alcohol or use illicit drugs.   Prenatal Transfer Tool  Maternal Diabetes: No- unknown Genetic Screening: Declined Maternal Ultrasounds/Referrals: Declined- no PNC Fetal Ultrasounds or other Referrals:  None Maternal Substance Abuse:  Yes:  Type: Smoker Significant Maternal Medications:  None Significant Maternal Lab Results:  Lab values include: Other: GBS unknown Other Comments:  None  Review of Systems  All other systems reviewed and are negative.    Dilation: 10 Station: 0;+1 Exam by:: Davina Poke, RN  Blood pressure 193/113, pulse 104, temperature 98.7 F (37.1 C), temperature source Axillary, resp. rate 18, height 5\' 2"  (1.575 m), weight 82.555 kg (182 lb), last menstrual period 09/29/2011, SpO2 99.00%, unknown if currently breastfeeding. Maternal Exam:  Uterine Assessment: Contraction strength is firm.  Contraction duration is 45 seconds. Contraction frequency is regular.   Abdomen: Patient reports no abdominal tenderness. Estimated fetal weight is 6.   Fetal presentation: vertex  Pelvis: adequate for delivery.      Fetal Exam Fetal Monitor Review: Mode: ultrasound.     Baseline rate: 135.  Variability: moderate (6-25 bpm).   Pattern: accelerations present and no decelerations.    Fetal State Assessment: Category I - tracings are normal.     Physical Exam  Constitutional: She is oriented to person, place, and time. She appears well-developed and well-nourished.  HENT:  Head: Normocephalic and atraumatic.  Neck: Neck supple.  Cardiovascular: Normal rate, regular rhythm and normal heart sounds.   Respiratory: Effort normal and breath sounds normal.  Musculoskeletal: Normal range of motion.  Neurological: She is alert and oriented to person, place, and time.  Skin: Skin is warm and dry.  Psychiatric: She has a normal mood and affect. Her behavior is normal.    Prenatal labs:- unknown ABO, Rh:   Antibody:   Rubella:   RPR:    HBsAg:    HIV:    GBS:     Assessment/Plan: 31 yo G3P2002 @ [redacted]w[redacted]d by unclear dating with no PNC who presents in active labor with ctx every 2 minutes and SVE 10/100/0 with a bulging bag.   1) preterm labor - admit to L&D - routine orders  2) HTN - start Mag 4/2 given elevated pressures - IV labetalol 20mg  push for current elevation - send CMP, CBC and spot prot/cr  3) FWB - category I tracing - GBS unknown and preterm- will treat with ampicillin and avoid rupture  4) Anticipate SVD  Reola Calkins, Keli 06/08/2012, 11:48 PM  I have seen and examined this patient and I agree with the above.  Will also collect UDS and prenatal labs. CBC/CMET essentially nl with no indication of preeclampsia. Continue mag due to severe range BPs. GBS PCR with neg result. SW consult after delivery. Cam Hai 4:25 AM 06/09/2012

## 2012-06-09 ENCOUNTER — Encounter (HOSPITAL_COMMUNITY): Payer: Self-pay | Admitting: *Deleted

## 2012-06-09 DIAGNOSIS — O0930 Supervision of pregnancy with insufficient antenatal care, unspecified trimester: Secondary | ICD-10-CM

## 2012-06-09 DIAGNOSIS — D649 Anemia, unspecified: Secondary | ICD-10-CM

## 2012-06-09 DIAGNOSIS — O1002 Pre-existing essential hypertension complicating childbirth: Secondary | ICD-10-CM

## 2012-06-09 DIAGNOSIS — O9903 Anemia complicating the puerperium: Secondary | ICD-10-CM

## 2012-06-09 LAB — RAPID URINE DRUG SCREEN, HOSP PERFORMED
Barbiturates: NOT DETECTED
Cocaine: NOT DETECTED
Tetrahydrocannabinol: NOT DETECTED

## 2012-06-09 LAB — GROUP B STREP BY PCR: Group B strep by PCR: NEGATIVE

## 2012-06-09 LAB — RAPID HIV SCREEN (WH-MAU): Rapid HIV Screen: NONREACTIVE

## 2012-06-09 LAB — RPR: RPR Ser Ql: NONREACTIVE

## 2012-06-09 LAB — PROTEIN / CREATININE RATIO, URINE
Creatinine, Urine: 163.04 mg/dL
Total Protein, Urine: 187.9 mg/dL

## 2012-06-09 MED ORDER — AMLODIPINE BESYLATE 10 MG PO TABS
10.0000 mg | ORAL_TABLET | Freq: Every day | ORAL | Status: DC
  Administered 2012-06-09: 10 mg via ORAL
  Filled 2012-06-09 (×2): qty 1

## 2012-06-09 MED ORDER — AMLODIPINE BESYLATE 10 MG PO TABS: 10.0000 mg | ORAL_TABLET | Freq: Every day | ORAL | Status: DC

## 2012-06-09 MED ORDER — TETANUS-DIPHTH-ACELL PERTUSSIS 5-2.5-18.5 LF-MCG/0.5 IM SUSP
0.5000 mL | Freq: Once | INTRAMUSCULAR | Status: AC
  Administered 2012-06-11: 0.5 mL via INTRAMUSCULAR
  Filled 2012-06-09: qty 0.5

## 2012-06-09 MED ORDER — OXYCODONE-ACETAMINOPHEN 5-325 MG PO TABS: 1.0000 | ORAL_TABLET | ORAL | Status: DC | PRN

## 2012-06-09 MED ORDER — DIPHENHYDRAMINE HCL 25 MG PO CAPS: 25.0000 mg | ORAL_CAPSULE | Freq: Four times a day (QID) | ORAL | Status: DC | PRN

## 2012-06-09 MED ORDER — SENNOSIDES-DOCUSATE SODIUM 8.6-50 MG PO TABS: 2.0000 | ORAL_TABLET | Freq: Every day | ORAL | Status: DC

## 2012-06-09 MED ORDER — WITCH HAZEL-GLYCERIN EX PADS: 1.0000 "application " | MEDICATED_PAD | CUTANEOUS | Status: DC | PRN

## 2012-06-09 MED ORDER — IBUPROFEN 600 MG PO TABS
600.0000 mg | ORAL_TABLET | Freq: Four times a day (QID) | ORAL | Status: DC
  Administered 2012-06-09 – 2012-06-12 (×12): 600 mg via ORAL
  Filled 2012-06-09 (×12): qty 1

## 2012-06-09 MED ORDER — PRENATAL MULTIVITAMIN CH
1.0000 | ORAL_TABLET | Freq: Every day | ORAL | Status: DC
  Administered 2012-06-09 – 2012-06-12 (×4): 1 via ORAL
  Filled 2012-06-09 (×5): qty 1

## 2012-06-09 MED ORDER — TERBUTALINE SULFATE 1 MG/ML IJ SOLN: 0.2500 mg | Freq: Once | INTRAMUSCULAR | Status: DC | PRN

## 2012-06-09 MED ORDER — LABETALOL HCL 5 MG/ML IV SOLN
10.0000 mg | INTRAVENOUS | Status: DC | PRN
  Administered 2012-06-09 – 2012-06-12 (×5): 10 mg via INTRAVENOUS
  Filled 2012-06-09 (×4): qty 4

## 2012-06-09 MED ORDER — DIBUCAINE 1 % RE OINT
1.0000 "application " | TOPICAL_OINTMENT | RECTAL | Status: DC | PRN
  Filled 2012-06-09: qty 28

## 2012-06-09 MED ORDER — BENZOCAINE-MENTHOL 20-0.5 % EX AERO
1.0000 "application " | INHALATION_SPRAY | CUTANEOUS | Status: DC | PRN
  Filled 2012-06-09: qty 56

## 2012-06-09 MED ORDER — SIMETHICONE 80 MG PO CHEW
80.0000 mg | CHEWABLE_TABLET | ORAL | Status: DC | PRN
  Administered 2012-06-09: 80 mg via ORAL

## 2012-06-09 MED ORDER — ONDANSETRON HCL 4 MG/2ML IJ SOLN: 4.0000 mg | INTRAMUSCULAR | Status: DC | PRN

## 2012-06-09 MED ORDER — OXYTOCIN 40 UNITS IN LACTATED RINGERS INFUSION - SIMPLE MED
1.0000 m[IU]/min | INTRAVENOUS | Status: DC
  Administered 2012-06-09: 2 m[IU]/min via INTRAVENOUS

## 2012-06-09 MED ORDER — ZOLPIDEM TARTRATE 5 MG PO TABS: 5.0000 mg | ORAL_TABLET | Freq: Every evening | ORAL | Status: DC | PRN

## 2012-06-09 MED ORDER — ONDANSETRON HCL 4 MG PO TABS: 4.0000 mg | ORAL_TABLET | ORAL | Status: DC | PRN

## 2012-06-09 MED ORDER — INFLUENZA VIRUS VACC SPLIT PF IM SUSP
0.5000 mL | INTRAMUSCULAR | Status: AC
  Filled 2012-06-09: qty 0.5

## 2012-06-09 MED ORDER — LANOLIN HYDROUS EX OINT: TOPICAL_OINTMENT | CUTANEOUS | Status: DC | PRN

## 2012-06-09 MED ORDER — LACTATED RINGERS IV SOLN
INTRAVENOUS | Status: DC
  Administered 2012-06-09 (×2): via INTRAVENOUS

## 2012-06-09 NOTE — Progress Notes (Signed)
UR chart review completed.  

## 2012-06-09 NOTE — Progress Notes (Signed)
Rebecca Andrews is a 31 y.o. G3P2002 at [redacted]w[redacted]d by LMP admitted for active labor  Subjective: Pt more comfortable after IV fentanyl.  Still not feeling pressure to push.    Objective: BP 179/103  Pulse 90  Temp 98.7 F (37.1 C) (Axillary)  Resp 18  Ht 5\' 2"  (1.575 m)  Wt 82.555 kg (182 lb)  BMI 33.29 kg/m2  SpO2 99%  LMP 09/29/2011  Breastfeeding? Unknown   Total I/O In: 540 [P.O.:240; I.V.:300] Out: 100 [Urine:100]  FHT:  FHR: 135 bpm, variability: moderate,  accelerations:  Present,  decelerations:  Present occasional variable UC:   irregular, every 2-6 minutes  SVE:   Dilation: 10 Station: +2 Exam by:: Dr. Reola Calkins   Labs: Lab Results  Component Value Date   WBC 13.2* 06/08/2012   HGB 9.7* 06/08/2012   HCT 29.9* 06/08/2012   MCV 92.0 06/08/2012   PLT 183 06/08/2012    Assessment / Plan: Spontaneous labor, progressing normally. No prenatal care.   Labor: AROM performed wtih return of clear fluid. continuing ot progress Preeclampsia:  on magnesium sulfate, no signs or symptoms of toxicity, intake and ouput balanced and labs stable. Still hypertensive. Cont to treat with labetalol IV prn.  Fetal Wellbeing:  Category I Pain Control:  Fentanyl I/D:  n/a Anticipated MOD:  NSVD  Reola Calkins, Aundrey Elahi 06/09/2012, 1:22 AM

## 2012-06-10 LAB — OPIATE, QUANTITATIVE, URINE
6 Monoacetylmorphine, Ur-Confirm: NEGATIVE ng/mL
Codeine Urine: NEGATIVE ng/mL
Hydrocodone: NEGATIVE ng/mL
Morphine, Confirm: NEGATIVE ng/mL
Norhydrocodone, Ur: NEGATIVE ng/mL
Oxycodone, ur: NEGATIVE ng/mL

## 2012-06-10 MED ORDER — DIBUCAINE 1 % RE OINT: 1.0000 "application " | TOPICAL_OINTMENT | RECTAL | Status: DC | PRN

## 2012-06-10 MED ORDER — HYDROCHLOROTHIAZIDE 25 MG PO TABS
25.0000 mg | ORAL_TABLET | Freq: Every day | ORAL | Status: DC
  Administered 2012-06-10 – 2012-06-12 (×3): 25 mg via ORAL
  Filled 2012-06-10 (×5): qty 1

## 2012-06-10 MED ORDER — DIPHENHYDRAMINE HCL 25 MG PO CAPS: 25.0000 mg | ORAL_CAPSULE | Freq: Four times a day (QID) | ORAL | Status: DC | PRN

## 2012-06-10 MED ORDER — BENZOCAINE-MENTHOL 20-0.5 % EX AERO: 1.0000 "application " | INHALATION_SPRAY | CUTANEOUS | Status: DC | PRN

## 2012-06-10 MED ORDER — ONDANSETRON HCL 4 MG/2ML IJ SOLN: 4.0000 mg | INTRAMUSCULAR | Status: DC | PRN

## 2012-06-10 MED ORDER — IBUPROFEN 600 MG PO TABS: 600.0000 mg | ORAL_TABLET | Freq: Four times a day (QID) | ORAL | Status: DC

## 2012-06-10 MED ORDER — OXYCODONE-ACETAMINOPHEN 5-325 MG PO TABS: 1.0000 | ORAL_TABLET | ORAL | Status: DC | PRN

## 2012-06-10 MED ORDER — ONDANSETRON HCL 4 MG PO TABS: 4.0000 mg | ORAL_TABLET | ORAL | Status: DC | PRN

## 2012-06-10 MED ORDER — SENNOSIDES-DOCUSATE SODIUM 8.6-50 MG PO TABS: 2.0000 | ORAL_TABLET | Freq: Every day | ORAL | Status: DC

## 2012-06-10 MED ORDER — TETANUS-DIPHTH-ACELL PERTUSSIS 5-2.5-18.5 LF-MCG/0.5 IM SUSP: 0.5000 mL | Freq: Once | INTRAMUSCULAR | Status: DC

## 2012-06-10 MED ORDER — LABETALOL HCL 5 MG/ML IV SOLN
20.0000 mg | Freq: Once | INTRAVENOUS | Status: AC
  Administered 2012-06-10: 20 mg via INTRAVENOUS
  Filled 2012-06-10: qty 4

## 2012-06-10 MED ORDER — SIMETHICONE 80 MG PO CHEW: 80.0000 mg | CHEWABLE_TABLET | ORAL | Status: DC | PRN

## 2012-06-10 MED ORDER — WITCH HAZEL-GLYCERIN EX PADS: 1.0000 "application " | MEDICATED_PAD | CUTANEOUS | Status: DC | PRN

## 2012-06-10 MED ORDER — LANOLIN HYDROUS EX OINT: TOPICAL_OINTMENT | CUTANEOUS | Status: DC | PRN

## 2012-06-10 MED ORDER — ZOLPIDEM TARTRATE 5 MG PO TABS: 5.0000 mg | ORAL_TABLET | Freq: Every evening | ORAL | Status: DC | PRN

## 2012-06-10 MED ORDER — PRENATAL MULTIVITAMIN CH: 1.0000 | ORAL_TABLET | Freq: Every day | ORAL | Status: DC

## 2012-06-10 NOTE — Progress Notes (Signed)
Post Partum Day 1 Subjective: no complaints  Objective: Blood pressure 122/70, pulse 72, temperature 98.2 F (36.8 C), temperature source Oral, resp. rate 16, height 5\' 2"  (1.575 m), weight 177 lb 3.2 oz (80.377 kg), last menstrual period 09/29/2011, SpO2 99.00%, unknown if currently breastfeeding.  Physical Exam:  General: alert, cooperative and no distress Lochia: appropriate Uterine Fundus: firm Incision: na DVT Evaluation: No evidence of DVT seen on physical exam. Negative Homan's sign.   Basename 06/08/12 2300  HGB 9.7*  HCT 29.9*    Assessment/Plan: Plan for discharge tomorrow   LOS: 2 days   EURE,LUTHER H 06/10/2012, 7:43 AM

## 2012-06-10 NOTE — Progress Notes (Signed)
2118 BP 175/109 Contacted Teaching Service Arvil Persons MD.  Order given Labetelol 20 mg IV now.  Will continue to monitor. Dahlia Byes Boschen

## 2012-06-11 ENCOUNTER — Ambulatory Visit: Payer: Self-pay | Admitting: Obstetrics and Gynecology

## 2012-06-11 LAB — CBC
HCT: 16.4 % — ABNORMAL LOW (ref 36.0–46.0)
Hemoglobin: 5.1 g/dL — CL (ref 12.0–15.0)
Hemoglobin: 6.2 g/dL — CL (ref 12.0–15.0)
MCH: 29.5 pg (ref 26.0–34.0)
MCH: 30.4 pg (ref 26.0–34.0)
MCH: 29.5 pg (ref 26.0–34.0)
MCHC: 31.1 g/dL (ref 30.0–36.0)
MCHC: 32.1 g/dL (ref 30.0–36.0)
MCHC: 32.5 g/dL (ref 30.0–36.0)
MCV: 94.6 fL (ref 78.0–100.0)
MCV: 90.8 fL (ref 78.0–100.0)
Platelets: 264 10*3/uL (ref 150–400)
RBC: 2.04 MIL/uL — ABNORMAL LOW (ref 3.87–5.11)
RDW: 13.7 % (ref 11.5–15.5)
RDW: 14.9 % (ref 11.5–15.5)

## 2012-06-11 MED ORDER — DIPHENHYDRAMINE HCL 50 MG/ML IJ SOLN
25.0000 mg | Freq: Four times a day (QID) | INTRAMUSCULAR | Status: DC | PRN
Start: 2012-06-11 — End: 2012-06-12

## 2012-06-11 MED ORDER — LISINOPRIL 10 MG PO TABS
10.0000 mg | ORAL_TABLET | Freq: Every day | ORAL | Status: DC
  Administered 2012-06-11 – 2012-06-12 (×2): 10 mg via ORAL
  Filled 2012-06-11 (×4): qty 1

## 2012-06-11 MED ORDER — HYDRALAZINE HCL 20 MG/ML IJ SOLN: 10.0000 mg | INTRAMUSCULAR | Status: DC | PRN

## 2012-06-11 MED ORDER — FUROSEMIDE 10 MG/ML IJ SOLN
20.0000 mg | Freq: Once | INTRAMUSCULAR | Status: AC
  Administered 2012-06-11: 20 mg via INTRAVENOUS
  Filled 2012-06-11: qty 2

## 2012-06-11 MED ORDER — HYDRALAZINE HCL 20 MG/ML IJ SOLN
20.0000 mg | Freq: Once | INTRAMUSCULAR | Status: AC
  Administered 2012-06-11: 20 mg via INTRAVENOUS
  Filled 2012-06-11: qty 1

## 2012-06-11 MED ORDER — HYDRALAZINE HCL 20 MG/ML IJ SOLN
10.0000 mg | Freq: Once | INTRAMUSCULAR | Status: AC
  Administered 2012-06-11: 16:00:00 via INTRAVENOUS
  Filled 2012-06-11: qty 1

## 2012-06-11 MED ORDER — AMLODIPINE BESYLATE 10 MG PO TABS: 10.0000 mg | ORAL_TABLET | Freq: Once | ORAL | Status: DC

## 2012-06-11 MED ORDER — AMLODIPINE BESYLATE 10 MG PO TABS
10.0000 mg | ORAL_TABLET | Freq: Every day | ORAL | Status: DC
  Filled 2012-06-11: qty 1

## 2012-06-11 NOTE — Progress Notes (Signed)
Results for Rebecca Andrews, Rebecca Andrews (MRN [id number]) as of 06/11/2012 05:47  Ref. Range 06/11/2012 04:30  WBC Latest Range: 4.0-10.5 K/uL 11.3 (H)  RBC Latest Range: 3.87-5.11 MIL/uL 1.73 (L)  Hemoglobin Latest Range: 12.0-15.0 g/dL 5.1 (LL)  HCT Latest Range: 36.0-46.0 % 16.4 (L)  MCV Latest Range: 78.0-100.0 fL 94.8  MCH Latest Range: 26.0-34.0 pg 29.5  MCHC Latest Range: 30.0-36.0 g/dL 40.9  RDW Latest Range: 11.5-15.5 % 13.7  Platelets Latest Range: 150-400 K/uL 215  Critical Value Hgb 5.1 Contacted Teaching Service McGill MD, No orders given.  Will continue to monitor. Dahlia Byes Boschen

## 2012-06-11 NOTE — Discharge Summary (Deleted)
Obstetric Discharge Summary  Postpartum Day 2:  Reason for Admission: onset of labor Prenatal Procedures: none Intrapartum Procedures: spontaneous vaginal delivery and GBS prophylaxis with Ampicillin for GBS unknown in preterm, with GBS PCR then negative. Postpartum Procedures: none Complications-Operative and Postpartum: none Hemoglobin  Date Value Range Status  06/11/2012 5.1* 12.0 - 15.0 g/dL Final     CRITICAL RESULT CALLED TO, READ BACK BY AND VERIFIED WITH:     JESSICA,M. AT 3086 ON 06/11/2012 BY HOUEGNIFIO M.      REPEATED TO VERIFY     HCT  Date Value Range Status  06/11/2012 16.4* 36.0 - 46.0 % Final   Subjective: Asymptomatic for low hemoglobin - no dizziness, shortness of breath, headache; asymptomatic for pre-eclampsia - no headache, vision changes, abdominal pain.  Wants to go home today.  Physical Exam:  BP 154/78  Pulse 91  Temp 99 F (37.2 C) (Oral)  Resp 18  Ht 5\' 2"  (1.575 m)  Wt 80.377 kg (177 lb 3.2 oz)  BMI 32.41 kg/m2  SpO2 100%  LMP 09/29/2011  Breastfeeding? Unknown General: alert, cooperative, appears stated age and no distress Pulm: nl effort Lochia: appropriate Uterine Fundus: firm Incision: n/a DVT Evaluation: No evidence of DVT seen on physical exam. No cords or calf tenderness. No significant calf/ankle edema.  Discharge Diagnoses: Preterm pregnancy - delivered, with pregnancy induced HTN receiving labetalol  Discharge Information: Date: 06/11/2012 Activity: pelvic rest Diet: routine Medications: PNV, Ibuprofen, Colace and Iron Condition: stable Instructions: refer to practice specific booklet Discharge to: home F/u in 1 week at [facility] for pressures and to f/u Hgb. Planning to breastfeed, unsure about contraception - providing options in discharge information.  Newborn Data: Live born female  Birth Weight: 6 lb 11.4 oz (3045 g) APGAR: 8, 9  Home with mother.  [name] 06/11/2012, 8:00 AM

## 2012-06-11 NOTE — Progress Notes (Signed)
Post Partum Day 2: spontaneous vaginal delivery and GBS prophylaxis with Ampicillin for GBS unknown in preterm, with GBS PCR then negative.  Subjective: Patient was going to be discharged today because asymptomatic for low hemoglobin - no dizziness, shortness of breath, headache; asymptomatic for pre-eclampsia - no headache, vision changes, abdominal pain.  However, given low hemoglobin and high blood pressures, decided to stay overnight and get 1 unit transfusion and close monitoring of blood pressures.  Objective: Physical Exam:  BP 154/78  Pulse 91  Temp 99 F (37.2 C) (Oral)  Resp 18  Ht 5\' 2"  (1.575 m)  Wt 80.377 kg (177 lb 3.2 oz)  BMI 32.41 kg/m2  SpO2 100%  LMP 09/29/2011  Breastfeeding? Unknown General: alert, cooperative, appears stated age and no distress Pulm: nl effort Lochia: appropriate Uterine Fundus: firm Incision: n/a DVT Evaluation: No evidence of DVT seen on physical exam. No cords or calf tenderness. No significant calf/ankle edema.   Basename 06/11/12 1300 06/11/12 0430  HGB 6.2* 5.1*  HCT 19.3* 16.4*    Assessment/Plan: 30 y.o. [id number] postpartum day 2 with high blood pressures and anemia. Received 1 unit pRBC transfusion today  Received hydralazine x 2 for BP today in addition to HCTZ and lisinopril x 1; if BP continues to be a problem, consider IV labetalol Likely to receive lasix x 1 depending on pressures after transfusion - Plan for discharge tomorrow with PNV, ibuprofen, colace, and iron Discharge Information:Preterm pregnancy - delivered, with pregnancy induced HTN receiving labetalol Unsure about breastfeeding (stated planning to bf once and to bottle feed when asked later), unsure about contraception - providing options in discharge information. Likely will need to be discharged on antihypertensives with close F/u in 1 week at [facility] for pressures and to f/u Hgb.   LOS: 3 days   [name] 06/11/2012, 7:58 PM

## 2012-06-11 NOTE — Progress Notes (Addendum)
Faculty Practice [name], MD, PGY-1 Service phone [phone]  S: Pt with increasing blood pressures to SBP 180s prior to discharge and CBC with Hgb 5.1.  Though asymptomatic, requesting blood transfusion.    O: BP 173/88  Pulse 106  Temp 99.2 F (37.3 C) (Oral)  Resp 18  Ht 5\' 2"  (1.575 m)  Wt 80.377 kg (177 lb 3.2 oz)  BMI 32.41 kg/m2  SpO2 99%  LMP 09/29/2011  Breastfeeding? Unknown Otherwise, stable with negative orthostatics.  A/P: 31 y.o. [id number] PPD#2 from SVD with h/o gestational hypertension with continued elevated BPs and Hgb 5.1  1. Anemia - Pt mildly tachycardic but otherwise asymptomatic.  Hgb improved today to 6.2 from 5.1 yesterday.  Discussed with sister who is a Engineer, civil (consulting) and requesting transfusion - Transfuse 1 unit packed RBCs; decided upon 1 unit in stead of 2 given HTN and not wanting to overload patient. - Monitor overnight and recheck for symptoms, VS, and CBC tomorrow - Home with iron PO TID + colace - F/u in 1 week at [facility] to check hgb and symptoms  2. Gestational HTN - Pt with no prenatal care but states she had blood pressures up to SBP 200s this pregnancy and typically has high blood pressures a few days after delivery with her other pregnancies.  BP remains elevated in 170s-180s despite antihypertensives here. - Hydralazine q2 prn SBP >160 or DBP > 105. - Consider lasix today if signs of overload from transfusion - Discharge on home HCTZ plus norvasc or beta-blocker - [facility] BP recheck  [name] 06/11/2012 6:04 PM

## 2012-06-11 NOTE — Clinical Social Work Maternal (Signed)
Clinical Social Work Department PSYCHOSOCIAL ASSESSMENT - MATERNAL/CHILD 06/11/2012  Patient:  Rebecca Andrews,Rebecca Andrews  Account Number:  [id number]  Admit Date:  06/08/2012  Childs Name:   Shyla White    Clinical Social Worker:  Micca Matura, LCSW   Date/Time:  06/11/2012 09:30 AM  Date Referred:  06/11/2012   Referral source  CN     Referred reason  Other - See comment   Other referral source:   [facility]    I:  FAMILY / HOME ENVIRONMENT Child's legal guardian:  PARENT  Guardian - Name Guardian - Age Guardian - Address  Darrelyn Dykema [address] [place], [place] [address]  Robert White  Does not live with MOB   Other household support members/support persons Name Relationship DOB  Lakeisha Kastens AUNT   Shynee SISTER 5  Shaniya SISTER 2   Other support:   MGM, other maternal aunts, cousins and friends.    II  PSYCHOSOCIAL DATA Information Source:  Patient Interview  Financial and Community Resources Employment:   MOB works at [employer]   Financial resources:  Medicaid If Medicaid - County:  [place] Other  WIC  Food Stamps   School / Grade:   Maternity Care Coordinator / Child Services Coordination / Early Interventions:  Cultural issues impacting care:   None identified    III  STRENGTHS Strengths  Adequate Resources  Home prepared for Child (including basic supplies)  Other - See comment  Supportive family/friends   Strength comment:  Pediatrician is Dr. Rubin   IV  RISK FACTORS AND CURRENT PROBLEMS Current Problem:  None   Risk Factor & Current Problem Patient Issue Family Issue Risk Factor / Current Problem Comment   N N     V  SOCIAL WORK ASSESSMENT SW met with MOB in her first floor room/118 to complete assessment.  She was very welcoming and talkative and states that she did not know she was pregnant until the end of July when she planned to go back on birth control and knew she needed to take a pregnancy test first.  She was shocked when she  found out because she had not gained weight and continued to have a regular period.  She reports being in denial through the remainder of the pregnancy and did not tell anyone that she was pregnant.  She seems very well bonded to the infant at this time and states she is happy about the baby.  She reports having everything she needs for baby at home because she can get everything back that she has let friends borrow.  She reports she and FOB are not in a relationship at this time, but that he is involved and supportive and the father of her first child. SW informed her of hospital drug screen policy for [facility] and she was not at all concerned.  She denies all drug use. She thanked SW for coming to talk with her.      VI SOCIAL WORK PLAN Social Work Plan  No Further Intervention Required / No Barriers to Discharge  Other   Type of pt/family education:   Hospital drug screen policy   If child protective services report - county:   If child protective services report - date:   Information/referral to community resources comment:   No needs identified at this time.   Other social work plan:   Baby's UDS is negative.  SW will monitor toxicology.       Clinical Social Work Department PSYCHOSOCIAL ASSESSMENT - MATERNAL/CHILD 06/11/2012  Patient:  Rebecca Andrews, Rebecca Andrews  Account Number:  [id number]  Admit Date:  06/08/2012  Marjo Bicker Name:   Vincente Liberty    Clinical Social Worker:  Lulu Riding, LCSW   Date/Time:  06/11/2012 09:30 AM  Date Referred:  06/11/2012   Referral source  CN     Referred reason  Other - See comment   Other referral source:   [facility]    I:  FAMILY / HOME ENVIRONMENT Child's legal guardian:  PARENT  Guardian - Name Guardian - Age Guardian - Address  Rebecca Andrews [address] [place], [place] [address]  Lynnell Dike  Does not live with MOB   Other household support members/support persons Name Relationship DOB  Leann Mayweather AUNT   [place] 5  [place] SISTER 2   Other support:   MGM, other maternal aunts, cousins and friends.    II  PSYCHOSOCIAL DATA Information Source:  Patient Interview  Event organiser Employment:   MOB works at [employer] resources:  [employer] If [employer]:  [employer] Other  [employer] Stamps   School / Grade:   Maternity Care Coordinator / Child Services Coordination / Early Interventions:  Cultural issues impacting care:   None identified    III  STRENGTHS Strengths  Adequate Resources  Home prepared for Child (including basic supplies)  Other - See comment  Supportive family/friends   Strength comment:  Pediatrician is Dr. Donnie Coffin   IV  RISK FACTORS AND CURRENT PROBLEMS Current Problem:  None   Risk Factor & Current Problem Patient Issue Family Issue Risk Factor / Current Problem Comment   N N     V  SOCIAL WORK ASSESSMENT SW met with MOB in her first floor room/118 to complete assessment.  She was very welcoming and talkative and states that she did not know she was pregnant until the end of July when she planned to go back on birth control and knew she needed to take a pregnancy test first.  She was shocked when  she found out because she had not gained weight and continued to have a regular period.  She reports being in denial through the remainder of the pregnancy and did not tell anyone that she was pregnant.  She seems very well bonded to the infant at this time and states she is happy about the baby.  She reports having everything she needs for baby at home because she can get everything back that she has let friends borrow.  She reports she and FOB are not in a relationship at this time, but that he is involved and supportive and the father of her first child. SW informed her of hospital drug screen policy for [facility] and she was not at all concerned.  She denies all drug use. She thanked SW for coming to talk with her.      VI SOCIAL WORK PLAN Social Work Plan  No Further Intervention Required / No Barriers to Discharge  Other   Type of pt/family education:   Hospital drug screen policy   If child protective services report - county:   If child protective services report - date:   Information/referral to community resources comment:   No needs identified at this time.   Other social work plan:   Baby's UDS is negative.  SW will monitor toxicology.

## 2012-06-12 MED ORDER — HYDROCHLOROTHIAZIDE 25 MG PO TABS: 25.0000 mg | ORAL_TABLET | Freq: Every day | ORAL | Status: DC

## 2012-06-12 MED ORDER — IBUPROFEN 600 MG PO TABS: 600.0000 mg | ORAL_TABLET | Freq: Four times a day (QID) | ORAL | Status: DC | PRN

## 2012-06-12 MED ORDER — LISINOPRIL 10 MG PO TABS: 10.0000 mg | ORAL_TABLET | Freq: Every day | ORAL | Status: DC

## 2012-06-12 MED ORDER — FERROUS SULFATE 325 (65 FE) MG PO TABS: 325.0000 mg | ORAL_TABLET | Freq: Two times a day (BID) | ORAL | Status: DC

## 2012-06-12 NOTE — Discharge Summary (Signed)
Obstetric Discharge Summary Reason for Admission: onset of labor Prenatal Procedures: none; no prenatal care Intrapartum Procedures: spontaneous vaginal delivery Postpartum Procedures: none Complications-Operative and Postpartum: preeclampsia with 24 hrs PP mag Hemoglobin  Date Value Range Status  06/11/2012 7.7* 12.0 - 15.0 g/dL Final     DELTA CHECK NOTED     REPEATED TO VERIFY     HCT  Date Value Range Status  06/11/2012 23.7* 36.0 - 46.0 % Final    Physical Exam:  General: alert, cooperative and no distress Lochia: appropriate Uterine Fundus: firm DVT Evaluation: No evidence of DVT seen on physical exam.  Discharge Diagnoses: Preelampsia and 36wk delivery  Discharge Information: Date: 06/12/2012 Activity: pelvic rest Diet: routine Medications: Ibuprofen, Iron and lisinopril 10mg  daily, hctz 25mg  daily, pnv Condition: stable Instructions: refer to practice specific booklet Discharge to: home Follow-up Information    Follow up with [facility]. In 1 week. (for postpartum follow-up and to check your BP)    Contact information:   [address] [place] [address] [phone]          Newborn Data: Live born female  Birth Weight: 6 lb 11.4 oz (3045 g) APGAR: 8, 9  Home with mother. Bottlefeeding.  Pt seen/examined by kim shaw, cnm.   D/c meds reviewed w/ dr. Alfredo Martinez, Cheron Every 06/12/2012, 11:22 AM

## 2012-06-12 NOTE — Discharge Summary (Signed)
Attestation of Attending Supervision of Advanced Practitioner (CNM/NP): Evaluation and management procedures were performed by the Advanced Practitioner under my supervision and collaboration.  I have reviewed the Advanced Practitioner's note and chart, and I agree with the management and plan.  UGONNA  ANYANWU, MD, FACOG Attending Obstetrician & Gynecologist Faculty Practice, [facility]  

## 2012-06-12 NOTE — Discharge Summary (Signed)
Obstetric Discharge Summary Reason for Admission: onset of labor Prenatal Procedures: none; no prenatal care Intrapartum Procedures: spontaneous vaginal delivery Postpartum Procedures: none Complications-Operative and Postpartum: preeclampsia with 24 hrs PP mag Hemoglobin  Date Value Range Status  06/11/2012 7.7* 12.0 - 15.0 g/dL Final     DELTA CHECK NOTED     REPEATED TO VERIFY     HCT  Date Value Range Status  06/11/2012 23.7* 36.0 - 46.0 % Final    Physical Exam:  General: alert, cooperative and no distress Lochia: appropriate Uterine Fundus: firm DVT Evaluation: No evidence of DVT seen on physical exam.  Discharge Diagnoses: Preelampsia and 36wk delivery  Discharge Information: Date: 06/12/2012 Activity: pelvic rest Diet: routine Medications: see med list on d/c summary by Genella Rife CNM Condition: stable Instructions: refer to practice specific booklet Discharge to: home Follow-up Information    Follow up with [facility]. In 1 week. (for postpartum follow-up and to check your BP)    Contact information:   [address] [place] [address] [phone]          Newborn Data: Live born female  Birth Weight: 6 lb 11.4 oz (3045 g) APGAR: 8, 9  Home with mother. Bottlefeeding.  Cam Hai 06/12/2012, 8:44 AM

## 2012-06-13 LAB — TYPE AND SCREEN: Unit division: 0

## 2012-06-14 NOTE — Progress Notes (Signed)
I have seen and examined patient and agree with above. Change lisinopril to norvasc if breastfeeding. Pt requested medications on $4 list. Napoleon Form, MD

## 2012-06-16 NOTE — Discharge Summary (Signed)
Attestation of Attending Supervision of Advanced Practitioner (CNM/NP): Evaluation and management procedures were performed by the Advanced Practitioner under my supervision and collaboration.  I have reviewed the Advanced Practitioner's note and chart, and I agree with the management and plan.  Khaliah Barnick, MD, FACOG Attending Obstetrician & Gynecologist Faculty Practice, [facility]   

## 2012-06-18 ENCOUNTER — Ambulatory Visit: Payer: Self-pay | Admitting: Physician Assistant

## 2012-07-05 ENCOUNTER — Ambulatory Visit: Payer: Self-pay | Admitting: Family Medicine

## 2014-07-24 ENCOUNTER — Encounter (HOSPITAL_COMMUNITY): Payer: Self-pay | Admitting: *Deleted

## 2019-09-14 ENCOUNTER — Ambulatory Visit: Payer: BC Managed Care – PPO | Attending: Internal Medicine

## 2019-09-14 DIAGNOSIS — Z20822 Contact with and (suspected) exposure to covid-19: Secondary | ICD-10-CM

## 2019-09-15 LAB — NOVEL CORONAVIRUS, NAA: SARS-CoV-2, NAA: DETECTED — AB

## 2019-09-21 ENCOUNTER — Ambulatory Visit (HOSPITAL_COMMUNITY)
Admission: EM | Admit: 2019-09-21 | Discharge: 2019-09-21 | Disposition: A | Discharge status: Home / Self Care | Payer: BC Managed Care – PPO | Attending: Family Medicine | Admitting: Family Medicine

## 2019-09-21 ENCOUNTER — Other Ambulatory Visit: Payer: Self-pay

## 2019-09-21 DIAGNOSIS — U071 COVID-19: Secondary | ICD-10-CM

## 2019-09-21 NOTE — ED Notes (Signed)
Registered by accident, just needed to know COVID results.

## 2020-01-07 ENCOUNTER — Ambulatory Visit: Payer: BC Managed Care – PPO | Attending: Internal Medicine

## 2020-01-07 DIAGNOSIS — Z23 Encounter for immunization: Secondary | ICD-10-CM

## 2020-01-07 NOTE — Progress Notes (Signed)
   Covid-19 Vaccination Clinic  Name:  Rebecca Andrews    MRN: [id number] DOB: 09-25-1980  01/07/2020  [name] was observed post Covid-19 immunization for 15 minutes without incident. She was provided with Vaccine Information Sheet and instruction to access the V-Safe system.   [name] was instructed to call 911 with any severe reactions post vaccine: Marland Kitchen Difficulty breathing  . Swelling of face and throat  . A fast heartbeat  . A bad rash all over body  . Dizziness and weakness   Immunizations Administered    Name Date Dose VIS Date Route   Pfizer COVID-19 Vaccine 01/07/2020  3:39 PM 0.3 mL 09/02/2019 Intramuscular   Manufacturer: [employer], [employer]   Lot: [id number]   NDC: [address]-7

## 2020-01-30 ENCOUNTER — Ambulatory Visit: Payer: BC Managed Care – PPO | Attending: Internal Medicine

## 2020-01-30 DIAGNOSIS — Z23 Encounter for immunization: Secondary | ICD-10-CM

## 2020-01-30 NOTE — Progress Notes (Signed)
   Covid-19 Vaccination Clinic  Name:  Rebecca Andrews    MRN: [id number] DOB: 07-11-1981  01/30/2020  Rebecca Andrews was observed post Covid-19 immunization for 15 minutes without incident. She was provided with Vaccine Information Sheet and instruction to access the V-Safe system.   Rebecca Andrews was instructed to call 911 with any severe reactions post vaccine: Marland Kitchen Difficulty breathing  . Swelling of face and throat  . A fast heartbeat  . A bad rash all over body  . Dizziness and weakness   Immunizations Administered    Name Date Dose VIS Date Route   Pfizer COVID-19 Vaccine 01/30/2020 10:47 AM 0.3 mL 11/16/2018 Intramuscular   Manufacturer: [employer], [employer]   Lot: [id number]   NDC: [address]-2

## 2020-09-24 ENCOUNTER — Ambulatory Visit
Admission: RE | Admit: 2020-09-24 | Discharge: 2020-09-24 | Disposition: A | Discharge status: Home / Self Care | Payer: BC Managed Care – PPO | Source: Ambulatory Visit | Attending: Emergency Medicine | Admitting: Emergency Medicine

## 2020-09-24 ENCOUNTER — Other Ambulatory Visit: Payer: Self-pay

## 2020-09-24 VITALS — BP 205/131 | HR 87 | Temp 98.3°F | Resp 16 | Wt 197.0 lb

## 2020-09-24 DIAGNOSIS — Z20822 Contact with and (suspected) exposure to covid-19: Secondary | ICD-10-CM

## 2020-09-24 DIAGNOSIS — J019 Acute sinusitis, unspecified: Secondary | ICD-10-CM | POA: Diagnosis not present

## 2020-09-24 MED ORDER — AMOXICILLIN-POT CLAVULANATE 875-125 MG PO TABS: 1.0000 | ORAL_TABLET | Freq: Two times a day (BID) | ORAL | 0 refills | Status: DC

## 2020-09-24 MED ORDER — FLUTICASONE PROPIONATE 50 MCG/ACT NA SUSP: 1.0000 | Freq: Every day | NASAL | 0 refills | Status: DC

## 2020-09-24 MED ORDER — BENZONATATE 200 MG PO CAPS: 200.0000 mg | ORAL_CAPSULE | Freq: Three times a day (TID) | ORAL | 0 refills | Status: AC | PRN

## 2020-09-24 NOTE — ED Triage Notes (Signed)
Pt reports for the past a week having intermittent left ear pain, sinus drainage. Pt states she believes she has a sinus infection. Treating ear pain with ibuprofen.

## 2020-09-24 NOTE — ED Provider Notes (Signed)
[facility]    CSN: [id number] Arrival date & time: 09/24/20  1003      History   Chief Complaint Chief Complaint  Patient presents with  . appointment     1000    HPI Rebecca Andrews is a 40 y.o. female history of hypertension presenting today for evaluation of ear pain and congestion.  Reports symptoms were mild last week, worsened over the weekend.  Reports a lot of sinus pressure and drainage.  Had brief episode of fevers.  Does report possible Covid exposure at work.  Using ibuprofen.  HPI  History reviewed. No pertinent past medical history.  Patient Active Problem List   Diagnosis Date Noted  . ESSENTIAL HYPERTENSION, BENIGN 09/02/2010    History reviewed. No pertinent surgical history.  OB History    Gravida  3   Para  3   Term  2   Preterm  1   AB      Living  3     SAB      IAB      Ectopic      Multiple      Live Births  1            Home Medications    Prior to Admission medications   Medication Sig Start Date End Date Taking? Authorizing Provider  amoxicillin-clavulanate (AUGMENTIN) 875-125 MG tablet Take 1 tablet by mouth every 12 (twelve) hours. 09/24/20  Yes Tristyn Demarest C, PA-C  benzonatate (TESSALON) 200 MG capsule Take 1 capsule (200 mg total) by mouth 3 (three) times daily as needed for up to 7 days for cough. 09/24/20 10/01/20 Yes Jullian Clayson C, PA-C  fluticasone (FLONASE) 50 MCG/ACT nasal spray Place 1-2 sprays into both nostrils daily. 09/24/20  Yes Dejay Kronk C, PA-C  ferrous sulfate (FERROUSUL) 325 (65 FE) MG tablet Take 1 tablet (325 mg total) by mouth 2 (two) times daily with a meal. 06/12/12   Roma Schanz, CNM  hydrochlorothiazide (HYDRODIURIL) 25 MG tablet Take 1 tablet (25 mg total) by mouth daily. 06/12/12   Roma Schanz, CNM  ibuprofen (ADVIL,MOTRIN) 600 MG tablet Take 1 tablet (600 mg total) by mouth every 6 (six) hours as needed for pain. 06/12/12   Roma Schanz, CNM  lisinopril  (PRINIVIL,ZESTRIL) 10 MG tablet Take 1 tablet (10 mg total) by mouth daily. 06/12/12   Roma Schanz, CNM    Family History Family History  Problem Relation Age of Onset  . Diabetes Mother   . Hypertension Father     Social History Social History   Tobacco Use  . Smoking status: Former Smoker    Types: Cigarettes    Quit date: 2016    Years since quitting: 6.0  . Smokeless tobacco: Never Used  Substance Use Topics  . Alcohol use: No  . Drug use: No     Allergies   Patient has no known allergies.   Review of Systems Review of Systems  Constitutional: Negative for activity change, appetite change, chills, fatigue and fever.  HENT: Positive for congestion, ear pain, rhinorrhea and sinus pressure. Negative for sore throat and trouble swallowing.   Eyes: Negative for discharge and redness.  Respiratory: Negative for cough, chest tightness and shortness of breath.   Cardiovascular: Negative for chest pain.  Gastrointestinal: Negative for abdominal pain, diarrhea, nausea and vomiting.  Musculoskeletal: Negative for myalgias.  Skin: Negative for rash.  Neurological: Negative for dizziness, light-headedness and headaches.  Physical Exam Triage Vital Signs ED Triage Vitals  Enc Vitals Group     BP      Pulse      Resp      Temp      Temp src      SpO2      Weight      Height      Head Circumference      Peak Flow      Pain Score      Pain Loc      Pain Edu?      Excl. in GC?    No data found.  Updated Vital Signs BP (S) (!) 205/131 (BP Location: Left Arm)   Pulse 87   Temp 98.3 F (36.8 C) (Oral)   Resp 16   Wt 197 lb (89.4 kg)   LMP 09/19/2020 (Exact Date)   SpO2 98%   BMI 36.03 kg/m   Visual Acuity Right Eye Distance:   Left Eye Distance:   Bilateral Distance:    Right Eye Near:   Left Eye Near:    Bilateral Near:     Physical Exam Vitals and nursing note reviewed.  Constitutional:      Appearance: She is well-developed and  well-nourished.     Comments: No acute distress  HENT:     Head: Normocephalic and atraumatic.     Ears:     Comments: Bilateral ears without tenderness to palpation of external auricle, tragus and mastoid, EAC's without erythema or swelling, TM's with good bony landmarks and cone of light. Non erythematous.     Nose: Nose normal.     Mouth/Throat:     Comments: Oral mucosa pink and moist, no tonsillar enlargement or exudate. Posterior pharynx patent and nonerythematous, no uvula deviation or swelling. Normal phonation. Eyes:     Conjunctiva/sclera: Conjunctivae normal.  Cardiovascular:     Rate and Rhythm: Normal rate and regular rhythm.  Pulmonary:     Effort: Pulmonary effort is normal. No respiratory distress.     Comments: Breathing comfortably at rest, CTABL, no wheezing, rales or other adventitious sounds auscultated Abdominal:     General: There is no distension.  Musculoskeletal:        General: Normal range of motion.     Cervical back: Neck supple.  Skin:    General: Skin is warm and dry.  Neurological:     Mental Status: She is alert and oriented to person, place, and time.  Psychiatric:        Mood and Affect: Mood and affect normal.      UC Treatments / Results  Labs (all labs ordered are listed, but only abnormal results are displayed) Labs Reviewed  NOVEL CORONAVIRUS, NAA    EKG   Radiology No results found.  Procedures Procedures (including critical care time)  Medications Ordered in UC Medications - No data to display  Initial Impression / Assessment and Plan / UC Course  I have reviewed the triage vital signs and the nursing notes.  Pertinent labs & imaging results that were available during my care of the patient were reviewed by me and considered in my medical decision making (see chart for details).     Covid test pending given exposure.  Patient on Augmentin for sinusitis given length of symptoms and sinus pressure.  Continue  symptomatic and supportive care of congestion and cough.  Discussed significantly elevated blood pressure today, likely contributory from the Sudafed patient has been taking.  Provided contact for PCP to follow-up after symptoms resolve for blood pressure recheck.  Discussed strict return precautions. Patient verbalized understanding and is agreeable with plan.  Final Clinical Impressions(s) / UC Diagnoses   Final diagnoses:  Encounter for screening laboratory testing for COVID-19 virus  Acute sinusitis with symptoms > 10 days     Discharge Instructions     Covid test pending, monitor my chart for results Begin Augmentin twice daily for 1 week to treat sinus infection Flonase nasal spray 1 to 2 spray in each nostril daily x10 days Tessalon Perles as needed for cough Rest and fluids Follow-up if not improving or worsening  Blood pressure very high today, avoid Sudafed, follow-up with primary care for recheck and further evaluation of elevated blood pressure-use contact below    ED Prescriptions    Medication Sig Dispense Auth. Provider   amoxicillin-clavulanate (AUGMENTIN) 875-125 MG tablet Take 1 tablet by mouth every 12 (twelve) hours. 14 tablet Stacy Sailer C, PA-C   benzonatate (TESSALON) 200 MG capsule Take 1 capsule (200 mg total) by mouth 3 (three) times daily as needed for up to 7 days for cough. 28 capsule Mardell Suttles C, PA-C   fluticasone (FLONASE) 50 MCG/ACT nasal spray Place 1-2 sprays into both nostrils daily. 16 g Calem Cocozza, [place] C, PA-C     PDMP not reviewed this encounter.   Lew Dawes, [place] 09/24/20 1131

## 2020-09-24 NOTE — Discharge Instructions (Signed)
Covid test pending, monitor my chart for results Begin Augmentin twice daily for 1 week to treat sinus infection Flonase nasal spray 1 to 2 spray in each nostril daily x10 days Tessalon Perles as needed for cough Rest and fluids Follow-up if not improving or worsening  Blood pressure very high today, avoid Sudafed, follow-up with primary care for recheck and further evaluation of elevated blood pressure-use contact below

## 2020-09-25 LAB — NOVEL CORONAVIRUS, NAA: SARS-CoV-2, NAA: DETECTED — AB

## 2020-09-25 LAB — SARS-COV-2, NAA 2 DAY TAT

## 2024-04-14 ENCOUNTER — Emergency Department (HOSPITAL_COMMUNITY)

## 2024-04-14 ENCOUNTER — Inpatient Hospital Stay (HOSPITAL_COMMUNITY)
Admission: EM | Admit: 2024-04-14 | Discharge: 2024-04-18 | DRG: 304 | Disposition: A | Discharge status: Home / Self Care | Attending: Internal Medicine | Admitting: Internal Medicine

## 2024-04-14 ENCOUNTER — Other Ambulatory Visit: Payer: Self-pay

## 2024-04-14 ENCOUNTER — Encounter (HOSPITAL_COMMUNITY): Payer: Self-pay

## 2024-04-14 ENCOUNTER — Inpatient Hospital Stay (HOSPITAL_COMMUNITY)

## 2024-04-14 DIAGNOSIS — N179 Acute kidney failure, unspecified: Secondary | ICD-10-CM | POA: Diagnosis present

## 2024-04-14 DIAGNOSIS — I613 Nontraumatic intracerebral hemorrhage in brain stem: Secondary | ICD-10-CM | POA: Diagnosis not present

## 2024-04-14 DIAGNOSIS — I150 Renovascular hypertension: Secondary | ICD-10-CM | POA: Diagnosis not present

## 2024-04-14 DIAGNOSIS — E785 Hyperlipidemia, unspecified: Secondary | ICD-10-CM | POA: Diagnosis present

## 2024-04-14 DIAGNOSIS — I629 Nontraumatic intracranial hemorrhage, unspecified: Principal | ICD-10-CM

## 2024-04-14 DIAGNOSIS — E876 Hypokalemia: Secondary | ICD-10-CM | POA: Diagnosis present

## 2024-04-14 DIAGNOSIS — E66812 Obesity, class 2: Secondary | ICD-10-CM | POA: Diagnosis present

## 2024-04-14 DIAGNOSIS — I10 Essential (primary) hypertension: Secondary | ICD-10-CM | POA: Diagnosis present

## 2024-04-14 DIAGNOSIS — Z87891 Personal history of nicotine dependence: Secondary | ICD-10-CM

## 2024-04-14 DIAGNOSIS — Z91199 Patient's noncompliance with other medical treatment and regimen due to unspecified reason: Secondary | ICD-10-CM | POA: Diagnosis not present

## 2024-04-14 DIAGNOSIS — Z8249 Family history of ischemic heart disease and other diseases of the circulatory system: Secondary | ICD-10-CM | POA: Diagnosis not present

## 2024-04-14 DIAGNOSIS — I161 Hypertensive emergency: Secondary | ICD-10-CM | POA: Diagnosis present

## 2024-04-14 DIAGNOSIS — Z6836 Body mass index (BMI) 36.0-36.9, adult: Secondary | ICD-10-CM | POA: Diagnosis not present

## 2024-04-14 DIAGNOSIS — H5500 Unspecified nystagmus: Secondary | ICD-10-CM | POA: Diagnosis present

## 2024-04-14 DIAGNOSIS — G8191 Hemiplegia, unspecified affecting right dominant side: Secondary | ICD-10-CM | POA: Diagnosis present

## 2024-04-14 DIAGNOSIS — R29702 NIHSS score 2: Secondary | ICD-10-CM | POA: Diagnosis present

## 2024-04-14 DIAGNOSIS — I674 Hypertensive encephalopathy: Secondary | ICD-10-CM | POA: Diagnosis not present

## 2024-04-14 DIAGNOSIS — Z833 Family history of diabetes mellitus: Secondary | ICD-10-CM

## 2024-04-14 DIAGNOSIS — G936 Cerebral edema: Secondary | ICD-10-CM | POA: Diagnosis present

## 2024-04-14 DIAGNOSIS — D649 Anemia, unspecified: Secondary | ICD-10-CM | POA: Diagnosis present

## 2024-04-14 DIAGNOSIS — Z79899 Other long term (current) drug therapy: Secondary | ICD-10-CM

## 2024-04-14 LAB — CBC WITH DIFFERENTIAL/PLATELET
Abs Immature Granulocytes: 0.02 K/uL (ref 0.00–0.07)
Basophils Absolute: 0 K/uL (ref 0.0–0.1)
Basophils Relative: 0 %
Eosinophils Absolute: 0 K/uL (ref 0.0–0.5)
Eosinophils Relative: 0 %
HCT: 33.7 % — ABNORMAL LOW (ref 36.0–46.0)
Hemoglobin: 10.2 g/dL — ABNORMAL LOW (ref 12.0–15.0)
Immature Granulocytes: 0 %
Lymphocytes Relative: 19 %
Lymphs Abs: 1.4 K/uL (ref 0.7–4.0)
MCH: 25.4 pg — ABNORMAL LOW (ref 26.0–34.0)
MCHC: 30.3 g/dL (ref 30.0–36.0)
MCV: 83.8 fL (ref 80.0–100.0)
Monocytes Absolute: 0.7 K/uL (ref 0.1–1.0)
Monocytes Relative: 9 %
Neutro Abs: 5.2 K/uL (ref 1.7–7.7)
Neutrophils Relative %: 72 %
Platelets: 398 K/uL (ref 150–400)
RBC: 4.02 MIL/uL (ref 3.87–5.11)
RDW: 16.9 % — ABNORMAL HIGH (ref 11.5–15.5)
WBC: 7.4 K/uL (ref 4.0–10.5)
nRBC: 0 % (ref 0.0–0.2)

## 2024-04-14 LAB — COMPREHENSIVE METABOLIC PANEL WITH GFR
ALT: 13 U/L (ref 0–44)
AST: 19 U/L (ref 15–41)
Albumin: 3.5 g/dL (ref 3.5–5.0)
Alkaline Phosphatase: 62 U/L (ref 38–126)
Anion gap: 12 (ref 5–15)
BUN: 13 mg/dL (ref 6–20)
CO2: 22 mmol/L (ref 22–32)
Calcium: 8.8 mg/dL — ABNORMAL LOW (ref 8.9–10.3)
Chloride: 101 mmol/L (ref 98–111)
Creatinine, Ser: 0.9 mg/dL (ref 0.44–1.00)
GFR, Estimated: >60 mL/min (ref >60)
Glucose, Bld: 112 mg/dL — ABNORMAL HIGH (ref 70–99)
Potassium: 3.3 mmol/L — ABNORMAL LOW (ref 3.5–5.1)
Sodium: 135 mmol/L (ref 135–145)
Total Bilirubin: 0.6 mg/dL (ref 0.0–1.2)
Total Protein: 8 g/dL (ref 6.5–8.1)

## 2024-04-14 LAB — URINALYSIS, ROUTINE W REFLEX MICROSCOPIC
Bilirubin Urine: NEGATIVE
Glucose, UA: NEGATIVE mg/dL
Hgb urine dipstick: NEGATIVE
Ketones, ur: NEGATIVE mg/dL
Leukocytes,Ua: NEGATIVE
Nitrite: NEGATIVE
Protein, ur: 30 mg/dL — AB
Specific Gravity, Urine: 1.013 (ref 1.005–1.030)
pH: 7 (ref 5.0–8.0)

## 2024-04-14 LAB — HCG, SERUM, QUALITATIVE: Preg, Serum: NEGATIVE

## 2024-04-14 LAB — LIPASE, BLOOD: Lipase: 30 U/L (ref 11–51)

## 2024-04-14 MED ORDER — PANTOPRAZOLE SODIUM 40 MG IV SOLR: 40.0000 mg | Freq: Every day | INTRAVENOUS | Status: DC

## 2024-04-14 MED ORDER — STROKE: EARLY STAGES OF RECOVERY BOOK
Freq: Once | Status: AC
  Administered 2024-04-15: 1
  Filled 2024-04-14: qty 1

## 2024-04-14 MED ORDER — METOCLOPRAMIDE HCL 5 MG/ML IJ SOLN
10.0000 mg | Freq: Once | INTRAMUSCULAR | Status: AC
  Administered 2024-04-14: 10 mg via INTRAVENOUS
  Filled 2024-04-14: qty 2

## 2024-04-14 MED ORDER — SENNOSIDES-DOCUSATE SODIUM 8.6-50 MG PO TABS
1.0000 | ORAL_TABLET | Freq: Two times a day (BID) | ORAL | Status: DC
  Administered 2024-04-17 – 2024-04-18 (×2): 1 via ORAL
  Filled 2024-04-14 (×4): qty 1

## 2024-04-14 MED ORDER — LABETALOL HCL 5 MG/ML IV SOLN
20.0000 mg | Freq: Once | INTRAVENOUS | Status: AC
  Administered 2024-04-14: 20 mg via INTRAVENOUS
  Filled 2024-04-14: qty 4

## 2024-04-14 MED ORDER — ACETAMINOPHEN 160 MG/5ML PO SOLN: 650.0000 mg | ORAL | Status: DC | PRN

## 2024-04-14 MED ORDER — ACETAMINOPHEN 650 MG RE SUPP: 650.0000 mg | RECTAL | Status: DC | PRN

## 2024-04-14 MED ORDER — CLEVIDIPINE BUTYRATE 0.5 MG/ML IV EMUL
0.0000 mg/h | INTRAVENOUS | Status: DC
  Administered 2024-04-14: 2 mg/h via INTRAVENOUS
  Administered 2024-04-15: 14 mg/h via INTRAVENOUS
  Administered 2024-04-15: 8 mg/h via INTRAVENOUS
  Administered 2024-04-15: 16 mg/h via INTRAVENOUS
  Filled 2024-04-14 (×6): qty 100

## 2024-04-14 MED ORDER — LACTATED RINGERS IV BOLUS
1000.0000 mL | Freq: Once | INTRAVENOUS | Status: AC
  Administered 2024-04-14: 1000 mL via INTRAVENOUS

## 2024-04-14 MED ORDER — ACETAMINOPHEN 325 MG PO TABS
650.0000 mg | ORAL_TABLET | ORAL | Status: DC | PRN
  Administered 2024-04-17: 650 mg via ORAL
  Filled 2024-04-14: qty 2

## 2024-04-14 NOTE — ED Notes (Signed)
 Patient transported to CT

## 2024-04-14 NOTE — ED Notes (Signed)
 Meds paused to clean pt up due to incontinence.

## 2024-04-14 NOTE — H&P (Signed)
 NEUROLOGY H&P NOTE   Date of service: April 14, 2024 Patient Name: Rebecca Andrews MRN:  [id number] DOB:  May 24, 1981 Chief Complaint: Slurred speech, headache, dizziness  History of Present Illness  Rebecca Andrews is a 43 y.o. female with hx of possibly undiagnosed hypertension presenting to the emergency department for evaluation of sudden onset of headache, dizziness and slurred speech that she started noticing somewhere around 5:30 PM.  Her blood pressures were found to be in the systolic 230s.  Head CT was performed as she had also complained of numbness in her upper extremities and weakness on the right side.  Head CT revealed a pontine hemorrhage.  Neurology was consulted for further recommendations. Patient's family was at bedside-sisters report that she has not seen a doctor in many years.  She does not monitor her blood pressures. Review of systems positive for preceding nausea vomiting and diarrhea.  No loss of consciousness.  No sickness or illness or sick contact in the preceding days.   Last known well: Probably somewhere around 5:30 PM Modified rankin score: 0-Completely asymptomatic and back to baseline post- stroke IV Thrombolysis: No-ICH Thrombectomy: No-ICH ICH Score1 NIHSS components Score: Comment  1a Level of Conscious 0[x]  1[]  2[]  3[]      1b LOC Questions 0[x]  1[]  2[]       1c LOC Commands 0[x]  1[]  2[]       2 Best Gaze 0[x]  1[]  2[]       3 Visual 0[x]  1[]  2[]  3[]      4 Facial Palsy 0[x]  1[]  2[]  3[]      5a Motor Arm - left 0[x]  1[]  2[]  3[]  4[]  UN[]    5b Motor Arm - Right 0[x]  1[]  2[]  3[]  4[]  UN[]    6a Motor Leg - Left 0[x]  1[]  2[]  3[]  4[]  UN[]    6b Motor Leg - Right 0[]  1[x]  2[]  3[]  4[]  UN[]    7 Limb Ataxia 0[x]  1[]  2[]  UN[]      8 Sensory 0[x]  1[]  2[]  UN[]      9 Best Language 0[x]  1[]  2[]  3[]      10 Dysarthria 0[]  1[x]  2[]  UN[]      11 Extinct. and Inattention 0[x]  1[]  2[]       TOTAL: 2      ROS  Comprehensive ROS performed and pertinent positives documented in  the HPI  Past History  History reviewed. No pertinent past medical history. History reviewed. No pertinent surgical history. Family History  Problem Relation Age of Onset   Diabetes Mother    Hypertension Father    Social History   Socioeconomic History   Marital status: Single    Spouse name: Not on file   Number of children: Not on file   Years of education: Not on file   Highest education level: Not on file  Occupational History   Not on file  Tobacco Use   Smoking status: Former    Current packs/day: 0.00    Types: Cigarettes    Quit date: 2016    Years since quitting: 9.5   Smokeless tobacco: Never  Substance and Sexual Activity   Alcohol use: No   Drug use: No   Sexual activity: Not on file  Other Topics Concern   Not on file  Social History Narrative   Not on file   Social Drivers of Health   Financial Resource Strain: Not on file  Food Insecurity: Not on file  Transportation Needs: Not on file  Physical Activity: Not on file  Stress: Not on file  Social Connections: Not  on file   No Known Allergies  Medications   Current Facility-Administered Medications:    [START ON 04/15/2024]  stroke: early stages of recovery book, , Does not apply, Once, Rebecca Haider, MD   acetaminophen  (TYLENOL ) tablet 650 mg, 650 mg, Oral, Q4H PRN **OR** acetaminophen  (TYLENOL ) 160 MG/5ML solution 650 mg, 650 mg, Per Tube, Q4H PRN **OR** acetaminophen  (TYLENOL ) suppository 650 mg, 650 mg, Rectal, Q4H PRN, Cochise Dinneen, MD   clevidipine  (CLEVIPREX ) infusion 0.5 mg/mL, 0-21 mg/hr, Intravenous, Continuous, Plunkett, Whitney, MD, Last Rate: 36 mL/hr at 04/14/24 2311, 18 mg/hr at 04/14/24 2311   pantoprazole  (PROTONIX ) injection 40 mg, 40 mg, Intravenous, QHS, Caroleen Stoermer, MD   senna-docusate (Senokot-S) tablet 1 tablet, 1 tablet, Oral, BID, Josph Norfleet, MD  Current Outpatient Medications:    acetaminophen  (TYLENOL ) 500 MG tablet, Take 1,000 mg by mouth every 6 (six) hours as  needed for mild pain (pain score 1-3) or headache., Disp: , Rfl:    Vitals   Vitals:   04/14/24 2304 04/14/24 2306 04/14/24 2308 04/14/24 2310  BP: (!) 166/86 (!) 174/88 (!) 161/78 (!) 151/81  Pulse: 83 80 83 86  Resp: (!) 36 (!) 38 (!) 21 (!) 28  Temp:      SpO2: 98% 96% 98% 97%  Weight:      Height:         Body mass index is 36.95 kg/m.  Physical Exam   General: Awake alert in no distress HEENT: Normocephalic atraumatic Lungs: Clear Vascular: Regular rhythm Abdomen nondistended nontender Neurological examination Awake alert oriented x 3 Speech is dysarthric No evidence of aphasia Cranials 2-12 intact Motor examination with mild drift in the right lower extremity, otherwise unremarkable. Sensation intact light touch without extinction No gross dysmetria   Labs   CBC:  Recent Labs  Lab 04/14/24 1917  WBC 7.4  NEUTROABS 5.2  HGB 10.2*  HCT 33.7*  MCV 83.8  PLT 398   Basic Metabolic Panel:  Lab Results  Component Value Date   NA 135 04/14/2024   K 3.3 (L) 04/14/2024   CO2 22 04/14/2024   GLUCOSE 112 (H) 04/14/2024   BUN 13 04/14/2024   CREATININE 0.90 04/14/2024   CALCIUM 8.8 (L) 04/14/2024   GFRNONAA >60 04/14/2024   GFRAA >90 06/08/2012  UA unremarkable  CT Head without contrast(Personally reviewed): Acute mid pontine hemorrhage   Assessment   Rebecca Andrews is a 43 y.o. female with likely undiagnosed hypertension as she has not seen a doctor in many years presenting for complaints of nausea vomiting dizziness, headache and right-sided weakness along with slurred speech that started suddenly at around 5:30 PM today.  Systolic blood pressure in the high 230s.  CT head with the mid pontine hemorrhage-no evidence of hydrocephalus.  Labs reveal hypokalemia.  Impression:  Pontine hemorrhage-etiology hypertensive Hypertensive emergency Hypokalemia  Recommendations  Admit to neuro ICU-neurology/stroke service. Frequent neurochecks Telemetry No  antiplatelets or anticoagulants-SCDs for DVT prophylaxis. Repeat head CT in 6 hours-ordered for 4 AM. CTA head and neck with 5-minute delay to look for any evidence of spot sign MRI brain with and without contrast at some point which has been ordered for 10 AM Strict blood pressure control with systolic blood pressure between 130-150.  Given 1 dose of labetalol  and started on Cleviprex . Check stroke risk factor workup including echocardiogram, A1c, lipid panel. Check UDS Therapy assessments Replete electrolytes as needed Check PT INR/PTT.   Had a detailed discussion with family members that the bleed is in  a pretty significant part of the brain/brainstem and she could have worsening with increasing edema in the next few days prior to things starting to improve.  She is at high risk for acute decompensation due to the location of her hemorrhage in the mid pons and will be admitted to the ICU for that reason for close neuromonitoring and strict blood pressure control.  She is not a surgical candidate based on the location of this bleed.  Plan discussed with patient, family members at bedside and Dr. Doretha in the ER  Stroke team to follow ______________________________________________________________________   Signed, Eligio Lav, MD [facility]  CRITICAL CARE ATTESTATION Performed by: Eligio Lav, MD Total critical care time: 36 minutes Critical care time was exclusive of separately billable procedures and treating other patients and/or supervising APPs/Residents/Students Critical care was necessary to treat or prevent imminent or life-threatening deterioration. This patient is critically ill and at significant risk for neurological worsening and/or death and care requires constant monitoring. Critical care was time spent personally by me on the following activities: development of treatment plan with patient and/or surrogate as well as nursing, discussions with  consultants, evaluation of patient's response to treatment, examination of patient, obtaining history from patient or surrogate, ordering and performing treatments and interventions, ordering and review of laboratory studies, ordering and review of radiographic studies, pulse oximetry, re-evaluation of patient's condition, participation in multidisciplinary rounds and medical decision making of high complexity in the care of this patient.

## 2024-04-14 NOTE — ED Provider Notes (Addendum)
 [place] EMERGENCY DEPARTMENT AT [facility] Provider Note   CSN: [id number] Arrival date & time: 04/14/24  1858     Patient presents with: Diarrhea and Nausea   Rebecca Andrews is a 43 y.o. female.   Patient is a 43 year old female with a history of gestational hypertension who reports the hypertension had improved but a year ago she saw a doctor and her blood pressure was elevated but she never followed up who is presenting today with several complaints.  Patient reports that around 330 or 4:00 today she developed a headache that was severe and felt like a migraine.  Then around 5 PM she started having vomiting and diarrhea.  She reports numerous episodes of vomiting and diarrhea then developing weakness and tingling initially in the right face, arm and leg but then generally throughout her entire body.  She also noticed she had some slurred speech.  Her sister is present and reports that she slid off the toilet but never lost consciousness.  She was not able to stand but her sister is a Engineer, civil (consulting) and reports she made her smile and did not have any focal deficits.  Other than her speech being slurred.  Patient was noted to be hypertensive by paramedics.  She denies any abdominal pain and reports the nausea comes and goes now.  Reports only having a minimal headache.  Reports her vision generally feels blurry which is a little worse than her baseline but is not wearing her glasses.  She denies any numbness or tingling in her legs now but still feels like her upper extremities are tingling a little bit.  She denies any chest pain, shortness of breath or abdominal pain at this time.  Last menses was this month.  Felt completely normal earlier today and worked and felt fine.  Does not have a history of frequent headaches.  No known sick contacts.  The history is provided by the patient, a relative and medical records.  Diarrhea      Prior to Admission medications   Medication Sig Start  Date End Date Taking? Authorizing Provider  amoxicillin -clavulanate (AUGMENTIN ) 875-125 MG tablet Take 1 tablet by mouth every 12 (twelve) hours. 09/24/20   Wieters, Hallie C, PA-C  ferrous sulfate  (FERROUSUL) 325 (65 FE) MG tablet Take 1 tablet (325 mg total) by mouth 2 (two) times daily with a meal. 06/12/12   Kizzie Suzen SAUNDERS, CNM  fluticasone  (FLONASE ) 50 MCG/ACT nasal spray Place 1-2 sprays into both nostrils daily. 09/24/20   Wieters, Hallie C, PA-C  hydrochlorothiazide  (HYDRODIURIL ) 25 MG tablet Take 1 tablet (25 mg total) by mouth daily. 06/12/12   Kizzie Suzen SAUNDERS, CNM  ibuprofen  (ADVIL ,MOTRIN ) 600 MG tablet Take 1 tablet (600 mg total) by mouth every 6 (six) hours as needed for pain. 06/12/12   Kizzie Suzen SAUNDERS, CNM  lisinopril  (PRINIVIL ,ZESTRIL ) 10 MG tablet Take 1 tablet (10 mg total) by mouth daily. 06/12/12   Kizzie Suzen SAUNDERS, CNM    Allergies: Patient has no known allergies.    Review of Systems  Gastrointestinal:  Positive for diarrhea.    Updated Vital Signs BP (!) 231/123   Pulse 90   Temp 98.5 F (36.9 C)   Resp 19   Ht 5' 2 (1.575 m)   Wt 91.6 kg   SpO2 98%   BMI 36.95 kg/m   Physical Exam Vitals and nursing note reviewed.  Constitutional:      General: She is not in acute distress.  Appearance: She is well-developed.  HENT:     Head: Normocephalic and atraumatic.     Mouth/Throat:     Mouth: Mucous membranes are dry.  Eyes:     General: No visual field deficit.    Extraocular Movements: Extraocular movements intact.     Conjunctiva/sclera: Conjunctivae normal.     Pupils: Pupils are equal, round, and reactive to light.  Cardiovascular:     Rate and Rhythm: Normal rate and regular rhythm.     Heart sounds: Normal heart sounds. No murmur heard.    No friction rub.  Pulmonary:     Effort: Pulmonary effort is normal.     Breath sounds: Normal breath sounds. No wheezing or rales.  Abdominal:     General: Bowel sounds are normal. There is no  distension.     Palpations: Abdomen is soft.     Tenderness: There is no abdominal tenderness. There is no guarding or rebound.  Musculoskeletal:        General: No tenderness. Normal range of motion.     Cervical back: Normal range of motion and neck supple.     Comments: No edema  Skin:    General: Skin is warm and dry.     Findings: No rash.  Neurological:     Mental Status: She is alert and oriented to person, place, and time.     Cranial Nerves: No cranial nerve deficit or facial asymmetry.     Sensory: Sensation is intact. No sensory deficit.     Motor: Motor function is intact. No weakness.     Coordination: Coordination normal. Finger-Nose-Finger Test and Heel to [facility] Test normal.     Comments: Slight slurred speech, 5 out of 5 strength in bilateral upper and lower extremities with normal sensation.  No notable facial droop.  Psychiatric:        Behavior: Behavior normal.     (all labs ordered are listed, but only abnormal results are displayed) Labs Reviewed  COMPREHENSIVE METABOLIC PANEL WITH GFR - Abnormal; Notable for the following components:      Result Value   Potassium 3.3 (*)    Glucose, Bld 112 (*)    Calcium 8.8 (*)    All other components within normal limits  CBC WITH DIFFERENTIAL/PLATELET - Abnormal; Notable for the following components:   Hemoglobin 10.2 (*)    HCT 33.7 (*)    MCH 25.4 (*)    RDW 16.9 (*)    All other components within normal limits  URINALYSIS, ROUTINE W REFLEX MICROSCOPIC - Abnormal; Notable for the following components:   APPearance CLOUDY (*)    Protein, ur 30 (*)    Bacteria, UA RARE (*)    All other components within normal limits  LIPASE, BLOOD  HCG, SERUM, QUALITATIVE    EKG: None  Radiology: CT Head Wo Contrast Addendum Date: 04/14/2024 ADDENDUM REPORT: 04/14/2024 22:34 ADDENDUM: These results were called by telephone at the time of interpretation on 04/14/2024 at 10:34 pm to provider Zorianna Taliaferro , who verbally  acknowledged these results. Electronically Signed   By: Morgane  Naveau M.D.   On: 04/14/2024 22:34   Result Date: 04/14/2024 CLINICAL DATA:  Headache, neuro deficit n/v/d for 1hr and now its stopped. C/O weakness. Denies SHOB and CP. BP 176 systolic and does not take BP meds but is supposed to EXAM: CT HEAD WITHOUT CONTRAST TECHNIQUE: Contiguous axial images were obtained from the base of the skull through the vertex without intravenous contrast. RADIATION  DOSE REDUCTION: This exam was performed according to the departmental dose-optimization program which includes automated exposure control, adjustment of the mA and/or kV according to patient size and/or use of iterative reconstruction technique. COMPARISON:  None Available. FINDINGS: Brain: No evidence of large-territorial acute infarction. Acute pontine hemorrhage best visualized on the sagittal view measuring up to 2 cm. No mass lesion. No extra-axial collection. No mass effect or midline shift. No hydrocephalus. Basilar cisterns are patent. Vascular: No hyperdense vessel. Skull: No acute fracture or focal lesion. Sinuses/Orbits: Paranasal sinuses and mastoid air cells are clear. The orbits are unremarkable. Other: None. IMPRESSION: Acute pontine hemorrhage measuring up to 2 cm. Electronically Signed: By: Morgane  Naveau M.D. On: 04/14/2024 22:31     Procedures   Medications Ordered in the ED  labetalol  (NORMODYNE ) injection 20 mg (has no administration in time range)  clevidipine  (CLEVIPREX ) infusion 0.5 mg/mL (has no administration in time range)  lactated ringers  bolus 1,000 mL (1,000 mLs Intravenous New Bag/Given 04/14/24 2203)  metoCLOPramide  (REGLAN ) injection 10 mg (10 mg Intravenous Given 04/14/24 2159)                                    Medical Decision Making Amount and/or Complexity of Data Reviewed Labs: ordered. Decision-making details documented in ED Course. Radiology: ordered and independent interpretation performed.  Decision-making details documented in ED Course. ECG/medicine tests: ordered and independent interpretation performed. Decision-making details documented in ED Course.  Risk Prescription drug management. Decision regarding hospitalization.   Pt with multiple medical problems and comorbidities and presenting today with a complaint that caries a high risk for morbidity and mortality.  Here today with the above complaints.  Seems to have started with a headache but did have nausea vomiting and diarrhea associated with this.  Concern for subarachnoid hemorrhage, hypertensive urgency, PRES, stroke versus infectious etiology vs ICH.  Also concern for possible electrolyte abnormality.  Patient has no abdominal pain at this time.  I independently interpreted patient's labs and lipase is normal at 30, CMP with minimal hypokalemia of 3.3 but otherwise normal, pregnancy negative and CBC without acute findings.  Patient has chronic anemia but hemoglobin has not changed.  UA is within normal limits.  Patient given antiemetics and IV fluids.  10:41 PM I have independently visualized and interpreted pt's images today.  Head CT shows an intracranial hemorrhage.  Radiology reports a pontine bleed most likely from hypertensive cause.  On repeat evaluation patient is still awake and alert.  Still hypertensive and will give labetalol  and start Cleviprex .  Neurology consulted.  Patient will need to go to the ICU.  Discussed all the results with the patient and her family.  She takes no anticoagulation at this time.  CRITICAL CARE Performed by: Kavonte Bearse Total critical care time: 40 minutes Critical care time was exclusive of separately billable procedures and treating other patients. Critical care was necessary to treat or prevent imminent or life-threatening deterioration. Critical care was time spent personally by me on the following activities: development of treatment plan with patient and/or surrogate as  well as nursing, discussions with consultants, evaluation of patient's response to treatment, examination of patient, obtaining history from patient or surrogate, ordering and performing treatments and interventions, ordering and review of laboratory studies, ordering and review of radiographic studies, pulse oximetry and re-evaluation of patient's condition.     Final diagnoses:  Intracranial hemorrhage [facility])    ED  Discharge Orders     None          Doretha Folks, MD 04/14/24 2241    Doretha Folks, MD 04/14/24 2242

## 2024-04-14 NOTE — ED Triage Notes (Addendum)
 C/O n/v/d for 1hr and now its stopped. C/O weakness. Denies SHOB and CP. BP 176 systolic and does not take BP meds but is supposed to. Pt states bilateral arms and legs are tingling/ facial numbness. Pt states losing bowels and bladder incontinence. Axox4.

## 2024-04-15 ENCOUNTER — Inpatient Hospital Stay (HOSPITAL_COMMUNITY)

## 2024-04-15 DIAGNOSIS — I161 Hypertensive emergency: Secondary | ICD-10-CM | POA: Diagnosis not present

## 2024-04-15 DIAGNOSIS — I10 Essential (primary) hypertension: Secondary | ICD-10-CM | POA: Diagnosis not present

## 2024-04-15 DIAGNOSIS — I674 Hypertensive encephalopathy: Secondary | ICD-10-CM

## 2024-04-15 DIAGNOSIS — I613 Nontraumatic intracerebral hemorrhage in brain stem: Secondary | ICD-10-CM

## 2024-04-15 DIAGNOSIS — R29702 NIHSS score 2: Secondary | ICD-10-CM | POA: Diagnosis not present

## 2024-04-15 DIAGNOSIS — G936 Cerebral edema: Secondary | ICD-10-CM

## 2024-04-15 LAB — CBC
HCT: 33.1 % — ABNORMAL LOW (ref 36.0–46.0)
Hemoglobin: 10.5 g/dL — ABNORMAL LOW (ref 12.0–15.0)
MCH: 25.9 pg — ABNORMAL LOW (ref 26.0–34.0)
MCHC: 31.7 g/dL (ref 30.0–36.0)
MCV: 81.5 fL (ref 80.0–100.0)
Platelets: 405 K/uL — ABNORMAL HIGH (ref 150–400)
RBC: 4.06 MIL/uL (ref 3.87–5.11)
RDW: 17.1 % — ABNORMAL HIGH (ref 11.5–15.5)
WBC: 8 K/uL (ref 4.0–10.5)
nRBC: 0 % (ref 0.0–0.2)

## 2024-04-15 LAB — COMPREHENSIVE METABOLIC PANEL WITH GFR
ALT: 13 U/L (ref 0–44)
AST: 31 U/L (ref 15–41)
Albumin: 3.2 g/dL — ABNORMAL LOW (ref 3.5–5.0)
Alkaline Phosphatase: 58 U/L (ref 38–126)
Anion gap: 11 (ref 5–15)
BUN: 7 mg/dL (ref 6–20)
CO2: 23 mmol/L (ref 22–32)
Calcium: 9 mg/dL (ref 8.9–10.3)
Chloride: 104 mmol/L (ref 98–111)
Creatinine, Ser: 0.77 mg/dL (ref 0.44–1.00)
GFR, Estimated: >60 mL/min (ref >60)
Glucose, Bld: 117 mg/dL — ABNORMAL HIGH (ref 70–99)
Potassium: 3.4 mmol/L — ABNORMAL LOW (ref 3.5–5.1)
Sodium: 138 mmol/L (ref 135–145)
Total Bilirubin: 0.6 mg/dL (ref 0.0–1.2)
Total Protein: 7.8 g/dL (ref 6.5–8.1)

## 2024-04-15 LAB — HEMOGLOBIN A1C
Hgb A1c MFr Bld: 5.2 % (ref 4.8–5.6)
Mean Plasma Glucose: 102.54 mg/dL

## 2024-04-15 LAB — ECHOCARDIOGRAM COMPLETE
AR max vel: 2.36 cm2
AV Area VTI: 2.51 cm2
AV Area mean vel: 2.55 cm2
AV Mean grad: 12 mmHg
AV Peak grad: 21.9 mmHg
Ao pk vel: 2.34 m/s
Est EF: >75
Height: 62 in
S' Lateral: 2 cm
Weight: 3232 [oz_av]

## 2024-04-15 LAB — LIPID PANEL
Cholesterol: 163 mg/dL (ref 0–200)
HDL: 56 mg/dL (ref >40)
LDL Cholesterol: 97 mg/dL (ref 0–99)
Total CHOL/HDL Ratio: 2.9 ratio
Triglycerides: 51 mg/dL (ref <150)
VLDL: 10 mg/dL (ref 0–40)

## 2024-04-15 LAB — RAPID URINE DRUG SCREEN, HOSP PERFORMED
Amphetamines: NOT DETECTED
Barbiturates: NOT DETECTED
Benzodiazepines: NOT DETECTED
Cocaine: NOT DETECTED
Opiates: NOT DETECTED
Tetrahydrocannabinol: NOT DETECTED

## 2024-04-15 LAB — APTT: aPTT: 30 s (ref 24–36)

## 2024-04-15 LAB — HIV ANTIBODY (ROUTINE TESTING W REFLEX): HIV Screen 4th Generation wRfx: NONREACTIVE

## 2024-04-15 LAB — MRSA NEXT GEN BY PCR, NASAL: MRSA by PCR Next Gen: NOT DETECTED

## 2024-04-15 LAB — PROTIME-INR
INR: 1.1 (ref 0.8–1.2)
Prothrombin Time: 14.4 s (ref 11.4–15.2)

## 2024-04-15 MED ORDER — LABETALOL HCL 5 MG/ML IV SOLN: 20.0000 mg | INTRAVENOUS | Status: DC | PRN

## 2024-04-15 MED ORDER — CHLORHEXIDINE GLUCONATE CLOTH 2 % EX PADS
6.0000 | MEDICATED_PAD | Freq: Every day | CUTANEOUS | Status: DC
  Administered 2024-04-15 – 2024-04-18 (×3): 6 via TOPICAL

## 2024-04-15 MED ORDER — LABETALOL HCL 100 MG PO TABS
100.0000 mg | ORAL_TABLET | Freq: Three times a day (TID) | ORAL | Status: DC
  Administered 2024-04-15: 100 mg via ORAL
  Filled 2024-04-15: qty 1

## 2024-04-15 MED ORDER — PERFLUTREN LIPID MICROSPHERE
1.0000 mL | INTRAVENOUS | Status: AC | PRN
  Administered 2024-04-15: 3 mL via INTRAVENOUS

## 2024-04-15 MED ORDER — LABETALOL HCL 100 MG PO TABS: 100.0000 mg | ORAL_TABLET | Freq: Two times a day (BID) | ORAL | Status: DC

## 2024-04-15 MED ORDER — LABETALOL HCL 200 MG PO TABS
200.0000 mg | ORAL_TABLET | Freq: Three times a day (TID) | ORAL | Status: DC
  Administered 2024-04-15 – 2024-04-18 (×9): 200 mg via ORAL
  Filled 2024-04-15: qty 1
  Filled 2024-04-15 (×2): qty 2
  Filled 2024-04-15 (×4): qty 1
  Filled 2024-04-15: qty 2
  Filled 2024-04-15: qty 1

## 2024-04-15 MED ORDER — LORAZEPAM 1 MG PO TABS
1.0000 mg | ORAL_TABLET | Freq: Once | ORAL | Status: DC | PRN
  Filled 2024-04-15: qty 1

## 2024-04-15 MED ORDER — IOHEXOL 350 MG/ML SOLN
75.0000 mL | Freq: Once | INTRAVENOUS | Status: AC | PRN
  Administered 2024-04-15: 75 mL via INTRAVENOUS

## 2024-04-15 MED ORDER — GADOBUTROL 1 MMOL/ML IV SOLN
9.5000 mL | Freq: Once | INTRAVENOUS | Status: AC | PRN
  Administered 2024-04-15: 9.5 mL via INTRAVENOUS

## 2024-04-15 MED ORDER — ORAL CARE MOUTH RINSE: 15.0000 mL | OROMUCOSAL | Status: DC | PRN

## 2024-04-15 MED ORDER — PANTOPRAZOLE SODIUM 40 MG PO TBEC
40.0000 mg | DELAYED_RELEASE_TABLET | Freq: Every day | ORAL | Status: DC
  Administered 2024-04-15 – 2024-04-17 (×3): 40 mg via ORAL
  Filled 2024-04-15 (×3): qty 1

## 2024-04-15 MED ORDER — POTASSIUM CHLORIDE CRYS ER 10 MEQ PO TBCR
40.0000 meq | EXTENDED_RELEASE_TABLET | Freq: Once | ORAL | Status: AC
  Administered 2024-04-15: 40 meq via ORAL
  Filled 2024-04-15: qty 4

## 2024-04-15 MED ORDER — AMLODIPINE BESYLATE 5 MG PO TABS
10.0000 mg | ORAL_TABLET | Freq: Every day | ORAL | Status: DC
  Administered 2024-04-15 – 2024-04-18 (×4): 10 mg via ORAL
  Filled 2024-04-15: qty 2
  Filled 2024-04-15: qty 1
  Filled 2024-04-15: qty 2
  Filled 2024-04-15: qty 1

## 2024-04-15 NOTE — Progress Notes (Signed)
 Echocardiogram 2D Echocardiogram has been performed.  Rebecca Andrews Kimani Bedoya RDCS 04/15/2024, 12:18 PM

## 2024-04-15 NOTE — Progress Notes (Signed)
 PT Cancellation Note  Patient Details Name: Rebecca Andrews MRN: [id number] DOB: 1980/10/07   Cancelled Treatment:    Reason Eval/Treat Not Completed: Active bedrest order. PT will follow up once bedrest orders are removed and when the pt is appropriate to mobilize.   Bernardino JINNY Ruth 04/15/2024, 7:48 AM

## 2024-04-15 NOTE — Evaluation (Signed)
 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 No pertinent past medical history. Past Surgical History: History reviewed. No pertinent surgical history. HPI:  Rebecca Andrews is a 43 yo female with no pertinent PMH presenting to ED 7/24 with sudden onset of headache, dizziness, and slurred speech. Found to be hypertensive and CTH revealed a pontine hemorrhage.   Assessment / Plan / Recommendation Clinical Impression  Pt reports she is independent at baseline, working full time and caring for her three daughters (6th grade, 8th grade, 12th grade). She scored WFL on all subsections of the Cognistat but presents with mild dysarthria, minimally affecting intelligibility at the conversation level. Provided education and will f/u at least briefly to target speech intelligibility. If dysarthria has not resolved at the time of discharge, pt is agreeable to OP SLP f/u.    SLP Assessment  SLP Recommendation/Assessment: Patient needs continued Speech Language Pathology Services SLP Visit Diagnosis: Dysarthria and anarthria (R47.1);Cognitive communication deficit (R41.841)     Assistance Recommended at Discharge  PRN  Functional Status Assessment Patient has had a recent decline in their functional status and demonstrates the ability to make significant improvements in function in a reasonable and predictable amount of time.  Frequency and Duration min 2x/week  1 week      SLP Evaluation Cognition  Overall Cognitive Status: Within Functional Limits for tasks assessed Orientation Level: Oriented X4       Comprehension  Auditory Comprehension Overall  Auditory Comprehension: Appears within functional limits for tasks assessed    Expression Expression Primary Mode of Expression: Verbal Verbal Expression Overall Verbal Expression: Appears within functional limits for tasks assessed Written Expression Dominant Hand: Right   Oral / Motor  Oral Motor/Sensory Function Overall Oral Motor/Sensory Function: Within functional limits Motor Speech Overall Motor Speech: Impaired Respiration: Within functional limits Phonation: Normal Resonance: Within functional limits Articulation: Impaired Level of Impairment: Conversation Intelligibility: Intelligibility reduced Conversation: 75-100% accurate            Damien Blumenthal, M.A., CCC-SLP Speech Language Pathology, Acute Rehabilitation Services  Secure Chat preferred [phone]  04/15/2024, 10:15 AM

## 2024-04-15 NOTE — Evaluation (Signed)
 Physical Therapy Evaluation Patient Details Name: Rebecca Andrews MRN: [id number] DOB: 05/20/1981 Today's Date: 04/15/2024  History of Present Illness  43 y.o. female presents to [facility] hospital on 04/14/2024 with sudden onset HA, dizziness and slurred speech. Head CT reveals pontine hemorrhage. PMH includes HTN.  Clinical Impression  Pt presents to PT with deficits in functional mobility, gait, balance, endurance, coordination, power, strength, sensation. Pt is able to ambulate for short household distances however the pt demonstrates significant alteration to gait pattern with reduced step length, widened BOS and high guard, losing her balance anteriorly. Pt will benefit from frequent mobilization in an effort to improve balance and gait quality. PT is hopeful the pt will progress quickly and recommends discharge home with outpatient PT.        If plan is discharge home, recommend the following: A little help with walking and/or transfers;A little help with bathing/dressing/bathroom;Assistance with cooking/housework;Assist for transportation;Help with stairs or ramp for entrance   Can travel by private vehicle        Equipment Recommendations Rolling walker (2 wheels);BSC/3in1  Recommendations for Other Services       Functional Status Assessment Patient has had a recent decline in their functional status and demonstrates the ability to make significant improvements in function in a reasonable and predictable amount of time.     Precautions / Restrictions Precautions Precautions: Fall Recall of Precautions/Restrictions: Intact Precaution/Restrictions Comments: SBP<160 Restrictions Weight Bearing Restrictions Per Provider Order: No      Mobility  Bed Mobility Overal bed mobility: Needs Assistance Bed Mobility: Supine to Sit     Supine to sit: Supervision          Transfers Overall transfer level: Needs assistance Equipment used:  (bed rail) Transfers: Sit to/from  Stand Sit to Stand: Contact guard assist                Ambulation/Gait Ambulation/Gait assistance: Min assist Gait Distance (Feet): 20 Feet Assistive device: None Gait Pattern/deviations: Step-to pattern, Wide base of support Gait velocity: reduced Gait velocity interpretation: <1.31 ft/sec, indicative of household ambulator   General Gait Details: short step-to gait, high guard, increased lateral sway, one anterior loss of balance  Stairs            Wheelchair Mobility     Tilt Bed    Modified Rankin (Stroke Patients Only) Modified Rankin (Stroke Patients Only) Pre-Morbid Rankin Score: No symptoms Modified Rankin: Moderately severe disability     Balance                                             Pertinent Vitals/Pain Pain Assessment Pain Assessment: No/denies pain    Home Living Family/patient expects to be discharged to:: Private residence Living Arrangements: Children;Other relatives Available Help at Discharge: Family;Available 24 hours/day (daughters 24/7 initially) Type of Home: House Home Access: Stairs to enter   Entrance Stairs-Number of Steps: 1 Alternate Level Stairs-Number of Steps: flight Home Layout: Two level;Able to live on main level with bedroom/bathroom Home Equipment: None      Prior Function Prior Level of Function : Independent/Modified Independent;Working/employed;Driving             Mobility Comments: Curator at [employer]       Extremity/Trunk Assessment   Upper Extremity Assessment Upper Extremity Assessment: RUE deficits/detail;LUE deficits/detail RUE Deficits / Details: ROM WFL, grossly 4+/5 RUE Sensation: decreased  light touch RUE Coordination: decreased fine motor LUE Deficits / Details: ROM WFL, grossly 4+/5 LUE Sensation:  (decreased sensation at hypothenar eminence only) LUE Coordination: decreased fine motor    Lower Extremity Assessment Lower Extremity Assessment: RLE  deficits/detail;LLE deficits/detail RLE Deficits / Details: ROM WFL, grossly 4+/5 RLE Sensation: decreased light touch    Cervical / Trunk Assessment Cervical / Trunk Assessment: Normal  Communication   Communication Communication: No apparent difficulties    Cognition Arousal: Alert Behavior During Therapy: WFL for tasks assessed/performed   PT - Cognitive impairments: No apparent impairments                         Following commands: Intact       Cueing Cueing Techniques: Verbal cues     General Comments General comments (skin integrity, edema, etc.): VSS on RA, BP supine 149/87, sitting 165/73 (RN titrated cleviprex ), after ambulation BP of 130/79    Exercises     Assessment/Plan    PT Assessment Patient needs continued PT services  PT Problem List Decreased strength;Decreased activity tolerance;Decreased balance;Decreased mobility;Impaired sensation       PT Treatment Interventions DME instruction;Gait training;Stair training;Functional mobility training;Therapeutic activities;Therapeutic exercise;Balance training;Neuromuscular re-education;Patient/family education    PT Goals (Current goals can be found in the Care Plan section)  Acute Rehab PT Goals Patient Stated Goal: to return to independence PT Goal Formulation: With patient Time For Goal Achievement: 04/29/24 Potential to Achieve Goals: Fair Additional Goals Additional Goal #1: Pt will score >19/24 on the DGI to indicate a reduced risk for falls Additional Goal #2: Pt will score >45/56 on the BERG to indicate a reduced risk for falls    Frequency Min 3X/week     Co-evaluation               AM-PAC PT 6 Clicks Mobility  Outcome Measure Help needed turning from your back to your side while in a flat bed without using bedrails?: A Little Help needed moving from lying on your back to sitting on the side of a flat bed without using bedrails?: A Little Help needed moving to and from a  bed to a chair (including a wheelchair)?: A Little Help needed standing up from a chair using your arms (e.g., wheelchair or bedside chair)?: A Little Help needed to walk in hospital room?: A Little Help needed climbing 3-5 steps with a railing? : Total 6 Click Score: 16    End of Session Equipment Utilized During Treatment: Gait belt Activity Tolerance: Patient tolerated treatment well Patient left: in chair;with call bell/phone within reach;with chair alarm set Nurse Communication: Mobility status PT Visit Diagnosis: Other abnormalities of gait and mobility (R26.89);Muscle weakness (generalized) (M62.81);Other symptoms and signs involving the nervous system (R29.898)    Time: 8648-8577 PT Time Calculation (min) (ACUTE ONLY): 31 min   Charges:   PT Evaluation $PT Eval Low Complexity: 1 Low   PT General Charges $$ ACUTE PT VISIT: 1 Visit         Bernardino JINNY Ruth, PT, DPT Acute Rehabilitation Office [phone]   Bernardino JINNY Ruth 04/15/2024, 3:17 PM

## 2024-04-15 NOTE — Progress Notes (Addendum)
 STROKE TEAM PROGRESS NOTE    SIGNIFICANT HOSPITAL EVENTS 7/25- admitted with ICH  INTERIM HISTORY/SUBJECTIVE Neuro exam stable today. Able to eat, denies headache, nausea, or dizziness.    OBJECTIVE  CBC    Component Value Date/Time   WBC 8.0 04/15/2024 0650   RBC 4.06 04/15/2024 0650   HGB 10.5 (L) 04/15/2024 0650   HCT 33.1 (L) 04/15/2024 0650   PLT 405 (H) 04/15/2024 0650   MCV 81.5 04/15/2024 0650   MCH 25.9 (L) 04/15/2024 0650   MCHC 31.7 04/15/2024 0650   RDW 17.1 (H) 04/15/2024 0650   LYMPHSABS 1.4 04/14/2024 1917   MONOABS 0.7 04/14/2024 1917   EOSABS 0.0 04/14/2024 1917   BASOSABS 0.0 04/14/2024 1917    BMET    Component Value Date/Time   NA 138 04/15/2024 0650   K 3.4 (L) 04/15/2024 0650   CL 104 04/15/2024 0650   CO2 23 04/15/2024 0650   GLUCOSE 117 (H) 04/15/2024 0650   BUN 7 04/15/2024 0650   CREATININE 0.77 04/15/2024 0650   CALCIUM 9.0 04/15/2024 0650   GFRNONAA >60 04/15/2024 0650    IMAGING past 24 hours CT HEAD POST STROKE FOLLOWUP/TIMED/STAT READ Result Date: 04/15/2024 EXAM: CT HEAD WITHOUT 04/15/2024 04:14:55 AM TECHNIQUE: CT of the head was performed without the administration of intravenous contrast. Automated exposure control, iterative reconstruction, and/or weight based adjustment of the mA/kV was utilized to reduce the radiation dose to as low as reasonably achievable. COMPARISON: CT head without contrast 04/14/2024 and CT angio head and neck 04/14/2024. CLINICAL HISTORY: Neuro deficit, acute, stroke suspected. Chief complaints; Diarrhea; Nausea; CT HEAD POST STROKE FOLLOWUP/TIMED/STAT READ; Neuro deficit, acute, stroke suspected. FINDINGS: BRAIN AND VENTRICLES: Pontine hemorrhage measuring 2.4 x 1.7 cm is stable to slightly decreased in size from the prior exam. No new hemorrhage is present. The supratentorial brain is unremarkable. No mass effect or midline shift. No extra-axial fluid collection. Gray-white differentiation is maintained. No  hydrocephalus. ORBITS: No acute abnormality. SINUSES AND MASTOIDS: No acute abnormality. SOFT TISSUES AND SKULL: No acute skull fracture. No acute soft tissue abnormality. IMPRESSION: 1. Stable to slightly decreased size of the pontine hemorrhage, measuring 2.4 x 1.7 cm. 2. No new hemorrhage. Electronically signed by: Lonni Necessary MD 04/15/2024 04:29 AM EDT RP Workstation: [id number]   CT ANGIO HEAD NECK W WO CM Result Date: 04/15/2024 CLINICAL DATA:  Stroke, hemorrhagic EXAM: CT ANGIOGRAPHY HEAD AND NECK WITH AND WITHOUT CONTRAST TECHNIQUE: Multidetector CT imaging of the head and neck was performed using the standard protocol during bolus administration of intravenous contrast. Multiplanar CT image reconstructions and MIPs were obtained to evaluate the vascular anatomy. Carotid stenosis measurements (when applicable) are obtained utilizing NASCET criteria, using the distal internal carotid diameter as the denominator. RADIATION DOSE REDUCTION: This exam was performed according to the departmental dose-optimization program which includes automated exposure control, adjustment of the mA and/or kV according to patient size and/or use of iterative reconstruction technique. CONTRAST:  75mL OMNIPAQUE IOHEXOL 350 MG/ML SOLN COMPARISON:  Same day CT head. FINDINGS: CTA NECK FINDINGS Aortic arch: Great vessel origins are patent without significant stenosis. Right carotid system: No evidence of dissection, stenosis (50% or greater), or occlusion. Left carotid system: No evidence of dissection, stenosis (50% or greater), or occlusion. Vertebral arteries: Codominant. No evidence of dissection, stenosis (50% or greater), or occlusion. Skeleton: No evidence of acute abnormality. Other neck: No evidence of acute abnormality. Upper chest: Visualized lung apices are clear. Review of the MIP images confirms the above  findings CTA HEAD FINDINGS Anterior circulation: Bilateral intracranial ICAs, MCAs, and ACAs are patent  without proximal hemodynamically significant stenosis. Posterior circulation: Bilateral intradural vertebral arteries, basilar artery and bilateral posterior cerebral arteries are patent without proximal hemodynamically significant stenosis. No aneurysm arteriovenous malformation identified. No blush of contrast to suggest active extravasation. Venous sinuses: As permitted by contrast timing, patent. Review of the MIP images confirms the above findings IMPRESSION: 1. No aneurysm or arteriovenous malformation identified. No evidence of active extravasation. 2. No large vessel occlusion or proximal hemodynamically significant stenosis. Electronically Signed   By: Gilmore GORMAN Molt M.D.   On: 04/15/2024 02:02   CT Head Wo Contrast Addendum Date: 04/14/2024 ADDENDUM REPORT: 04/14/2024 22:34 ADDENDUM: These results were called by telephone at the time of interpretation on 04/14/2024 at 10:34 pm to provider WHITNEY PLUNKETT , who verbally acknowledged these results. Electronically Signed   By: Morgane  Naveau M.D.   On: 04/14/2024 22:34   Result Date: 04/14/2024 CLINICAL DATA:  Headache, neuro deficit n/v/d for 1hr and now its stopped. C/O weakness. Denies SHOB and CP. BP 176 systolic and does not take BP meds but is supposed to EXAM: CT HEAD WITHOUT CONTRAST TECHNIQUE: Contiguous axial images were obtained from the base of the skull through the vertex without intravenous contrast. RADIATION DOSE REDUCTION: This exam was performed according to the departmental dose-optimization program which includes automated exposure control, adjustment of the mA and/or kV according to patient size and/or use of iterative reconstruction technique. COMPARISON:  None Available. FINDINGS: Brain: No evidence of large-territorial acute infarction. Acute pontine hemorrhage best visualized on the sagittal view measuring up to 2 cm. No mass lesion. No extra-axial collection. No mass effect or midline shift. No hydrocephalus. Basilar  cisterns are patent. Vascular: No hyperdense vessel. Skull: No acute fracture or focal lesion. Sinuses/Orbits: Paranasal sinuses and mastoid air cells are clear. The orbits are unremarkable. Other: None. IMPRESSION: Acute pontine hemorrhage measuring up to 2 cm. Electronically Signed: By: Morgane  Naveau M.D. On: 04/14/2024 22:31    Vitals:   04/15/24 0730 04/15/24 0745 04/15/24 0800 04/15/24 0815  BP: (!) 147/88 (!) 142/79 (!) 157/91 (!) 174/86  Pulse: 87 90 97 (!) 102  Resp: (!) 43 (!) 41 (!) 48 (!) 28  Temp:      TempSrc:      SpO2: 96% 97% 96% 97%  Weight:      Height:         PHYSICAL EXAM General:  Alert, well-nourished, well-developed patient in no acute distress Psych:  Mood and affect appropriate for situation CV: Regular rate and rhythm on monitor Respiratory:  Regular, unlabored respirations on room air GI: Abdomen soft and nontender   NEURO:  Mental Status: AA&Ox3, patient is able to give clear and coherent history Speech/Language: speech is dysarthric with no aphasia.  Naming, repetition, fluency, and comprehension intact.  Cranial Nerves:  II: PERRL. Visual fields full.  III, IV, VI: EOMI. Nystagmus noted with right gaze. Eyelids elevate symmetrically.  V: Sensation is intact to light touch and symmetrical to face.  VII: Face is symmetrical resting and smiling VIII: hearing intact to voice. IX, X: Palate elevates symmetrically. Phonation is normal.  KP:Dynloizm shrug 5/5. XII: tongue is midline without fasciculations. Motor: Mild drift in RLE, no drift in LLE Full strength in bilateral upper extremities, no drift Tone: is normal and bulk is normal Sensation- Intact to light touch bilaterally. Extinction absent to light touch to DSS.   Coordination: FTN intact bilaterally, HKS: no  ataxia in BLE.No drift.  Gait- deferred  Most Recent NIH 2     ASSESSMENT/PLAN  Ms. Rebecca Andrews is a 43 y.o. female with history of possibly undiagnosed hypertension  presenting to the emergency department for evaluation of sudden onset of headache, dizziness and slurred speech that she started noticing somewhere around 5:30 PM. Her blood pressures were found to be in the systolic 230s. Head CT was performed as she had also complained of numbness in her upper extremities and weakness on the right side. Head CT revealed a pontine hemorrhage.  NIH on Admission 2  ICH:   Pontine ICH, etiology:  Hypertension  Code Stroke CT head - Acute pontine hemorrhage measuring up to 2 cm.  CTA head & neck No aneurysm or AVM. No LVO CT Head- Stable to slightly decreased size of the pontine hemorrhage, measuring 2.4 x 1.7 cm. MRI  Similar appearance of pontine hemorrhage. Surrounding edema extending into the inferior midbrain into the left middle cerebellar peduncle. Scattered chronic microhemorrhages primarily in the basal ganglia, left thalamus, and cerebellum suggestive of hypertensive encephalopathy.   2D Echo - Flow acceleration throughout the left ventricle consistent with hyperdynamic function and LVH. 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 Left ventricular ejection fraction, by estimation, is >75%.      LDL 97 HgbA1c 5.2 UDS negative VTE prophylaxis - SCDs No antithrombotic prior to admission, now on No antithrombotic Therapy recommendations:  outpatient PT Disposition:  pending  Hypertensive emergency Home meds:  None Presented with extremely high BP IV Cleviprex , weaning down as able Start Norvasc  10 and PO labetalol  200mg  tid today Stable BP goal < 160 Long-term BP goal normotensive  Hyperlipidemia LDL 97, goal < 70 Consider to start statin at discharge  Other Stroke Risk Factors Obesity, Body mass index is 36.95 kg/m., BMI >/= 30 associated with increased stroke risk, recommend weight loss, diet and exercise as appropriate   Other medical  issues Hypokalemia, K 3.4, supplement  Hospital day # 1   Patient seen and examined by NP/APP with MD. MD to update note as needed.   Jorene Last, DNP, FNP-BC [facility] Pager: [phone]  ATTENDING NOTE: I reviewed above note and agree with the assessment and plan. Pt was seen and examined.   No family at bedside.  Patient reclined in bed, finished his lunch.  On exam, patient awake, alert, eyes open, orientated to age, place, time and people. No aphasia, fluent language, following all simple commands. Able to name and repeat.  Slight dysarthria. No gaze palsy, tracking bilaterally, visual field full, PERRL, left gaze unsustained nystagmus, upper gaze unsustained rotational nystagmus. No facial droop. Tongue midline. Bilateral UEs 5/5, no drift. Bilaterally LEs 5/5, no drift. Sensation symmetrical bilaterally, b/l FTN intact, gait not tested.   CT showed pontine hemorrhage, repeat CT and MRI is stable.  Patient's symptoms minimal for pontine hemorrhage.  BP still high, on Cleviprex , try to wean down by adding p.o. medication.  On diet.  Educated on home BP monitoring and compliant with BP medication.  PT and OT recommend outpatient therapy.  For detailed assessment and plan, please refer to above as I have made changes wherever appropriate.   Ary Cummins, MD PhD Stroke Neurology 04/15/2024 6:27 PM  This patient is critically ill due to pontine hemorrhage, hypertensive emergency and at significant risk of neurological worsening, death form hematoma expansion, brain herniation, brain  death, hypertensive encephalopathy. This patient's care requires constant monitoring of vital signs, hemodynamics, respiratory and cardiac monitoring, review of multiple databases, neurological assessment, discussion with family, other specialists and medical decision making of high complexity. I spent 35 minutes of neurocritical care time in the care of this patient.    To contact Stroke  Continuity provider, please refer to [email or web address]. After hours, contact General Neurology

## 2024-04-15 NOTE — Plan of Care (Signed)
Repeat CT head stable

## 2024-04-16 DIAGNOSIS — I161 Hypertensive emergency: Secondary | ICD-10-CM | POA: Diagnosis not present

## 2024-04-16 DIAGNOSIS — I613 Nontraumatic intracerebral hemorrhage in brain stem: Secondary | ICD-10-CM | POA: Diagnosis not present

## 2024-04-16 DIAGNOSIS — G936 Cerebral edema: Secondary | ICD-10-CM | POA: Diagnosis not present

## 2024-04-16 LAB — CBC
HCT: 30.9 % — ABNORMAL LOW (ref 36.0–46.0)
Hemoglobin: 9.6 g/dL — ABNORMAL LOW (ref 12.0–15.0)
MCH: 25.9 pg — ABNORMAL LOW (ref 26.0–34.0)
MCHC: 31.1 g/dL (ref 30.0–36.0)
MCV: 83.5 fL (ref 80.0–100.0)
Platelets: 363 K/uL (ref 150–400)
RBC: 3.7 MIL/uL — ABNORMAL LOW (ref 3.87–5.11)
RDW: 17.6 % — ABNORMAL HIGH (ref 11.5–15.5)
WBC: 5.6 K/uL (ref 4.0–10.5)
nRBC: 0 % (ref 0.0–0.2)

## 2024-04-16 LAB — BASIC METABOLIC PANEL WITH GFR
Anion gap: 10 (ref 5–15)
BUN: 9 mg/dL (ref 6–20)
CO2: 22 mmol/L (ref 22–32)
Calcium: 9 mg/dL (ref 8.9–10.3)
Chloride: 104 mmol/L (ref 98–111)
Creatinine, Ser: 1.38 mg/dL — ABNORMAL HIGH (ref 0.44–1.00)
GFR, Estimated: 50 mL/min — ABNORMAL LOW (ref >60)
Glucose, Bld: 98 mg/dL (ref 70–99)
Potassium: 3.9 mmol/L (ref 3.5–5.1)
Sodium: 136 mmol/L (ref 135–145)

## 2024-04-16 LAB — PHOSPHORUS: Phosphorus: 4.7 mg/dL — ABNORMAL HIGH (ref 2.5–4.6)

## 2024-04-16 LAB — MAGNESIUM: Magnesium: 1.9 mg/dL (ref 1.7–2.4)

## 2024-04-16 MED ORDER — HYDRALAZINE HCL 20 MG/ML IJ SOLN: 20.0000 mg | INTRAMUSCULAR | Status: DC | PRN

## 2024-04-16 MED ORDER — HEPARIN SODIUM (PORCINE) 5000 UNIT/ML IJ SOLN
5000.0000 [IU] | Freq: Three times a day (TID) | INTRAMUSCULAR | Status: DC
  Administered 2024-04-16 – 2024-04-18 (×7): 5000 [IU] via SUBCUTANEOUS
  Filled 2024-04-16 (×7): qty 1

## 2024-04-16 MED ORDER — SODIUM CHLORIDE 0.9 % IV BOLUS
500.0000 mL | Freq: Once | INTRAVENOUS | Status: AC
  Administered 2024-04-16: 500 mL via INTRAVENOUS

## 2024-04-16 NOTE — Progress Notes (Signed)
 Physical Therapy Treatment Patient Details Name: Rebecca Andrews MRN: [id number] DOB: 1981/05/05 Today's Date: 04/16/2024   History of Present Illness 43 y.o. female presents to [facility] hospital on 04/14/2024 with sudden onset HA, dizziness and slurred speech. Head CT reveals pontine hemorrhage. PMH includes HTN.    PT Comments  Pt endorsing feeling well today, just tired. Pt with good control of BP during session, SBP 130s throughout. Pt ambulatory with and without RW, pt benefitting from RW to increase independence and for balance. Otherwise, pt requiring steadying assist from PT and walks in high guard position for balance. RLE with knee hyperextension due to weakness and also appears mildly ataxic. PT recommending OP neuro rehab at d/c, pt preference for d/c home as opposed to other venues. PT to continue to follow.      If plan is discharge home, recommend the following: A little help with walking and/or transfers;A little help with bathing/dressing/bathroom;Assistance with cooking/housework;Assist for transportation;Help with stairs or ramp for entrance   Can travel by private vehicle        Equipment Recommendations  Rolling walker (2 wheels);BSC/3in1    Recommendations for Other Services       Precautions / Restrictions Precautions Precautions: Fall Recall of Precautions/Restrictions: Intact Precaution/Restrictions Comments: SBP<160 Restrictions Weight Bearing Restrictions Per Provider Order: No     Mobility  Bed Mobility Overal bed mobility: Needs Assistance Bed Mobility: Supine to Sit     Supine to sit: Supervision     General bed mobility comments: increased time, use of rails    Transfers Overall transfer level: Needs assistance Equipment used: None Transfers: Sit to/from Stand Sit to Stand: Contact guard assist           General transfer comment: slow to rise, pt self-steadies. stands x2, from EOB and chair    Ambulation/Gait Ambulation/Gait assistance:  Min assist, Contact guard assist Gait Distance (Feet): 125 Feet (x2 - first time without AD vs HHA and second time with RW) Assistive device: None, 1 person hand held assist, Rolling walker (2 wheels) Gait Pattern/deviations: Step-to pattern, Decreased stride length, Step-through pattern, Ataxic, Knee hyperextension - right Gait velocity: decr     General Gait Details: initially steadying assist and pt walking in high guard given imbalance, benefits from HHA. Second bout of gait trialled with RW, requiring close guard for safety and cues for sequencing, increasing step length, positioning in RW   Stairs             Wheelchair Mobility     Tilt Bed    Modified Rankin (Stroke Patients Only) Modified Rankin (Stroke Patients Only) Pre-Morbid Rankin Score: No symptoms Modified Rankin: Moderately severe disability     Balance Overall balance assessment: Needs assistance Sitting-balance support: No upper extremity supported, Feet supported Sitting balance-Leahy Scale: Fair     Standing balance support: During functional activity, Single extremity supported, No upper extremity supported Standing balance-Leahy Scale: Fair                              Hotel manager: No apparent difficulties  Cognition Arousal: Alert Behavior During Therapy: WFL for tasks assessed/performed   PT - Cognitive impairments: No apparent impairments                       PT - Cognition Comments: good awareness of deficits Following commands: Intact      Cueing Cueing Techniques: Verbal cues  Exercises  General Comments General comments (skin integrity, edema, etc.): vss on RA, SBP 130s throughout session      Pertinent Vitals/Pain Pain Assessment Pain Assessment: No/denies pain    Home Living                          Prior Function            PT Goals (current goals can now be found in the care plan section) Acute  Rehab PT Goals Patient Stated Goal: to return to independence PT Goal Formulation: With patient Time For Goal Achievement: 04/29/24 Potential to Achieve Goals: Good Additional Goals Additional Goal #1: Pt will score >19/24 on the DGI to indicate a reduced risk for falls Additional Goal #2: Pt will score >45/56 on the BERG to indicate a reduced risk for falls Progress towards PT goals: Progressing toward goals    Frequency    Min 3X/week      PT Plan      Co-evaluation              AM-PAC PT 6 Clicks Mobility   Outcome Measure  Help needed turning from your back to your side while in a flat bed without using bedrails?: A Little Help needed moving from lying on your back to sitting on the side of a flat bed without using bedrails?: A Little Help needed moving to and from a bed to a chair (including a wheelchair)?: A Little Help needed standing up from a chair using your arms (e.g., wheelchair or bedside chair)?: A Little Help needed to walk in hospital room?: A Little Help needed climbing 3-5 steps with a railing? : A Lot 6 Click Score: 17    End of Session Equipment Utilized During Treatment: Gait belt Activity Tolerance: Patient tolerated treatment well Patient left: in chair;with call bell/phone within reach;Other (comment) (pt states she will press call button and wait for staff assist prior to mobility OOB) Nurse Communication: Mobility status PT Visit Diagnosis: Other abnormalities of gait and mobility (R26.89);Muscle weakness (generalized) (M62.81);Other symptoms and signs involving the nervous system (R29.898)     Time: 8975-8950 PT Time Calculation (min) (ACUTE ONLY): 25 min  Charges:    $Gait Training: 8-22 mins $Neuromuscular Re-education: 8-22 mins PT General Charges $$ ACUTE PT VISIT: 1 Visit                     Johana RAMAN, PT DPT Acute Rehabilitation Services Secure Chat Preferred  Office [phone]    Tyquisha Sharps E Johna 04/16/2024, 12:02  PM

## 2024-04-16 NOTE — Progress Notes (Signed)
 A+Ox4. NIHs Range from 1-4, most recent NIH 1 with Dysarthria Noted. Clevi gtt Turned off at 2312 and has remained off. Low urine output noted overnight with . Patient has [place], will encourage her to void. ICU Status maintained.

## 2024-04-16 NOTE — Evaluation (Addendum)
 Occupational Therapy Evaluation Patient Details Name: Rebecca Andrews MRN: [id number] DOB: 08/29/1981 Today's Date: 04/16/2024   History of Present Illness   43 y.o. female presents to [facility] hospital on 04/14/2024 with sudden onset HA, dizziness and slurred speech. Head CT reveals pontine hemorrhage. PMH includes HTN.     Clinical Impressions Pt ind at baseline with ADLs/functional mobility, works as a Curator. Pt lives at home with family. Pt currently needing up to CGA for ADLs, supervision for bed mobility, and min A for transfers with 1 person HHA. Pt with unsteadiness upon standing, reliant on external assist for dynamic tasks. Pt with mild RUE weakness and impaired sensation compared to LUE. Pt presenting with impairments listed below, will follow acutely. Recommend OP OT at d/c.   BP seated pre-mobility: 139/73 (90) BP supine post-mobility: 140/ 68 (88)     If plan is discharge home, recommend the following:   A little help with walking and/or transfers;A little help with bathing/dressing/bathroom;Assist for transportation;Help with stairs or ramp for entrance     Functional Status Assessment   Patient has had a recent decline in their functional status and demonstrates the ability to make significant improvements in function in a reasonable and predictable amount of time.     Equipment Recommendations   None recommended by OT     Recommendations for Other Services   PT consult     Precautions/Restrictions   Precautions Precautions: Fall Recall of Precautions/Restrictions: Intact Precaution/Restrictions Comments: SBP<160 Restrictions Weight Bearing Restrictions Per Provider Order: No     Mobility Bed Mobility Overal bed mobility: Needs Assistance Bed Mobility: Sit to Supine       Sit to supine: Supervision        Transfers Overall transfer level: Needs assistance Equipment used: 1 person hand held assist Transfers: Sit to/from Stand Sit to  Stand: Min assist           General transfer comment: mild unsteadiness with 1 person HHA      Balance Overall balance assessment: Needs assistance Sitting-balance support: No upper extremity supported, Feet supported Sitting balance-Leahy Scale: Good     Standing balance support: During functional activity, Single extremity supported, No upper extremity supported Standing balance-Leahy Scale: Fair                             ADL either performed or assessed with clinical judgement   ADL Overall ADL's : Needs assistance/impaired Eating/Feeding: Set up;Sitting   Grooming: Set up;Sitting   Upper Body Bathing: Contact guard assist   Lower Body Bathing: Contact guard assist   Upper Body Dressing : Contact guard assist;Sitting   Lower Body Dressing: Contact guard assist;Sitting/lateral leans;Sit to/from stand   Toilet Transfer: Occupational hygienist and Hygiene: Contact guard assist       Functional mobility during ADLs: Minimal assistance       Vision   Additional Comments: nystagmus noted with upward L gaze     Perception Perception: Not tested       Praxis Praxis: Not tested       Pertinent Vitals/Pain Pain Assessment Pain Assessment: No/denies pain     Extremity/Trunk Assessment Upper Extremity Assessment Upper Extremity Assessment: Right hand dominant;RUE deficits/detail RUE Deficits / Details: ROM WFL, grossly 4+/5, incr numbness on R compared to LUE RUE Sensation: decreased light touch RUE Coordination: decreased fine motor LUE Deficits / Details: ROM WFL, grossly 4+/5 LUE Coordination:  decreased fine motor   Lower Extremity Assessment Lower Extremity Assessment: Defer to PT evaluation   Cervical / Trunk Assessment Cervical / Trunk Assessment: Normal   Communication Communication Communication: No apparent difficulties   Cognition Arousal: Alert Behavior During  Therapy: WFL for tasks assessed/performed                                 Following commands: Intact       Cueing  General Comments   Cueing Techniques: Verbal cues  VS on RA SBP <140 thorughout   Exercises     Shoulder Instructions      Home Living Family/patient expects to be discharged to:: Private residence Living Arrangements: Children;Other relatives Available Help at Discharge: Family;Available 24 hours/day Type of Home: House Home Access: Stairs to enter [employer] of Steps: 1   Home Layout: Two level;Able to live on main level with bedroom/bathroom Alternate Level Stairs-Number of Steps: flight   Bathroom Shower/Tub: Tub/shower unit         Home Equipment: None      Lives With: Daughter    Prior Functioning/Environment Prior Level of Function : Independent/Modified Independent;Working/employed;Driving             Mobility Comments: Curator at [employer] ADLs Comments: ind    OT Problem List: Decreased strength;Decreased range of motion;Decreased activity tolerance;Impaired balance (sitting and/or standing)   OT Treatment/Interventions: Self-care/ADL training;Therapeutic exercise;Energy conservation;DME and/or AE instruction;Therapeutic activities;Patient/family education;Balance training      OT Goals(Current goals can be found in the care plan section)   Acute Rehab OT Goals Patient Stated Goal: none stated OT Goal Formulation: With patient Time For Goal Achievement: 04/30/24 Potential to Achieve Goals: Good ADL Goals Pt Will Perform Tub/Shower Transfer: Tub transfer;Shower transfer;ambulating;Independently Additional ADL Goal #1: pt will perform fine motor RUE HEP in order to improve RUE strengthening for ADLs Additional ADL Goal #2: pt will perform standing ADL x10 min in order to improve dynamic standing balance/activity tolerance for OOB activity   OT Frequency:  Min 2X/week    Co-evaluation               AM-PAC OT 6 Clicks Daily Activity     Outcome Measure Help from another person eating meals?: A Little Help from another person taking care of personal grooming?: A Little Help from another person toileting, which includes using toliet, bedpan, or urinal?: A Little Help from another person bathing (including washing, rinsing, drying)?: A Little Help from another person to put on and taking off regular upper body clothing?: A Little Help from another person to put on and taking off regular lower body clothing?: A Little 6 Click Score: 18   End of Session Equipment Utilized During Treatment: Gait belt Nurse Communication: Mobility status  Activity Tolerance: Patient tolerated treatment well Patient left: in bed;with call bell/phone within reach;with bed alarm set  OT Visit Diagnosis: Unsteadiness on feet (R26.81);Other abnormalities of gait and mobility (R26.89);Muscle weakness (generalized) (M62.81)                Time: 8674-8656 OT Time Calculation (min): 18 min Charges:  OT General Charges $OT Visit: 1 Visit OT Evaluation $OT Eval Low Complexity: 1 Low  Fredericka Bottcher K, OTD, OTR/L SecureChat Preferred Acute Rehab [phone]   Riel Hirschman K Koonce 04/16/2024, 2:13 PM

## 2024-04-16 NOTE — Progress Notes (Addendum)
 STROKE TEAM PROGRESS NOTE    SIGNIFICANT HOSPITAL EVENTS 7/25- admitted with ICH  INTERIM HISTORY/SUBJECTIVE Neuro exam stable today. Able to eat, denies headache, nausea, or dizziness.  Off clevipex since 2300 last night BP adequately controlled.  Neurological exam is stable without focal deficits Transfer out of ICU today and transfer to hospitalist service   OBJECTIVE  CBC    Component Value Date/Time   WBC 5.6 04/16/2024 0404   RBC 3.70 (L) 04/16/2024 0404   HGB 9.6 (L) 04/16/2024 0404   HCT 30.9 (L) 04/16/2024 0404   PLT 363 04/16/2024 0404   MCV 83.5 04/16/2024 0404   MCH 25.9 (L) 04/16/2024 0404   MCHC 31.1 04/16/2024 0404   RDW 17.6 (H) 04/16/2024 0404   LYMPHSABS 1.4 04/14/2024 1917   MONOABS 0.7 04/14/2024 1917   EOSABS 0.0 04/14/2024 1917   BASOSABS 0.0 04/14/2024 1917    BMET    Component Value Date/Time   NA 136 04/16/2024 0404   K 3.9 04/16/2024 0404   CL 104 04/16/2024 0404   CO2 22 04/16/2024 0404   GLUCOSE 98 04/16/2024 0404   BUN 9 04/16/2024 0404   CREATININE 1.38 (H) 04/16/2024 0404   CALCIUM 9.0 04/16/2024 0404   GFRNONAA 50 (L) 04/16/2024 0404    IMAGING past 24 hours ECHOCARDIOGRAM COMPLETE Result Date: 04/15/2024    ECHOCARDIOGRAM REPORT   Patient Name:   Rebecca Andrews Date of Exam: 04/15/2024 Medical Rec #:  [id number]      Height:       62.0 in Accession #:    [id number]     Weight:       202.0 lb Date of Birth:  01-Feb-1981     BSA:          1.920 m Patient Age:    42 years       BP:           147/84 mmHg Patient Gender: F              HR:           96 bpm. Exam Location:  Inpatient Procedure: 2D Echo, Color Doppler and Cardiac Doppler (Both Spectral and Color            Flow Doppler were utilized during procedure). Indications:    Stroke i63.9  History:        Patient has no prior history of Echocardiogram examinations.                 Risk Factors:Hypertension.  Sonographer:    Damien Senior RDCS Referring Phys: [id number] ASHISH ARORA  IMPRESSIONS  1. There is flow acceleration throughout the left ventricle consistent with hyperdynamic function and LVH. 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 Left ventricular ejection fraction, by estimation, is >75%. Left ventricular ejection fraction by PLAX is 84 %. The left ventricle has hyperdynamic function. The left ventricle has no regional wall motion abnormalities. There is severe concentric left ventricular hypertrophy. Indeterminate diastolic filling due to E-A fusion.  2. Right ventricular systolic function is hyperdynamic. The right ventricular size is normal. Tricuspid regurgitation signal is inadequate for assessing PA pressure.  3. Left atrial size was moderately dilated.  4. There is slight SAM of the anterior leaflet consistent with hyperdynamic function and LVH. The mitral valve is abnormal. Mild mitral valve regurgitation.  5. There are elevated velocities across the aortic valve  despite good leaflet excursion, consistent with hyperdynamic LV function. The aortic valve is tricuspid. Aortic valve regurgitation is not visualized. Aortic valve area, by VTI measures 2.51 cm. Aortic valve mean gradient measures 12.0 mmHg. Aortic valve Vmax measures 2.34 m/s.  6. There are elevated velocities across the pulmonic valve despite good leaflet excursion, consistent with hyperdynamic RV function.  7. The inferior vena cava is normal in size with greater than 50% respiratory variability, suggesting right atrial pressure of 3 mmHg. 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 Would recommend nonurgent CMR and consideration of cardiology consult. FINDINGS  Left Ventricle: There is flow acceleration throughout the left ventricle consistent with hyperdynamic function and LVH. There is a small invagination in the LV septum which likely represents a myocardial  crypt, does not appear to communicate with the RV. Left ventricular ejection fraction, by estimation, is >75%. Left ventricular ejection fraction by PLAX is 84 %. The left ventricle has hyperdynamic function. The left ventricle has no regional wall motion abnormalities. The left ventricular internal cavity size was normal in size. There is severe concentric left ventricular hypertrophy. Indeterminate diastolic filling due to E-A fusion. The ratio of pulmonic flow to systemic flow (Qp/Qs ratio) is 0.80. Right Ventricle: The right ventricular size is normal. No increase in right ventricular wall thickness. Right ventricular systolic function is hyperdynamic. Tricuspid regurgitation signal is inadequate for assessing PA pressure. Left Atrium: Left atrial size was moderately dilated. Right Atrium: Right atrial size was normal in size. Pericardium: Trivial pericardial effusion is present. Mitral Valve: There is slight SAM of the anterior leaflet consistent with hyperdynamic function and LVH. The mitral valve is abnormal. Mild mitral valve regurgitation. Tricuspid Valve: The tricuspid valve is normal in structure. Tricuspid valve regurgitation is trivial. Aortic Valve: There are elevated velocities across the aortic valve despite good leaflet excursion, consistent with hyperdynamic LV function. The aortic valve is tricuspid. Aortic valve regurgitation is not visualized. Aortic valve mean gradient measures  12.0 mmHg. Aortic valve peak gradient measures 21.9 mmHg. Aortic valve area, by VTI measures 2.51 cm. Pulmonic Valve: The pulmonic valve was normal in structure. Pulmonic valve regurgitation is not visualized. Aorta: The aortic root and ascending aorta are structurally normal, with no evidence of dilitation. Pulmonary Artery: There are elevated velocities across the pulmonic valve despite good leaflet excursion, consistent with hyperdynamic RV function. Venous: The inferior vena cava is normal in size with greater than  50% respiratory variability, suggesting right atrial pressure of 3 mmHg. IAS/Shunts: No atrial level shunt detected by color flow Doppler. The ratio of pulmonic flow to systemic flow (Qp/Qs ratio) is 0.80.  LEFT VENTRICLE PLAX 2D LV EF:         Left ventricular ejection fraction by PLAX is 84 %. LVIDd:         4.20 cm LVIDs:         2.00 cm LV PW:         1.70 cm LV IVS:        2.00 cm LVOT diam:     1.90 cm LV SV:         95 LV SV Index:   49 LVOT Area:     2.84 cm  RIGHT VENTRICLE RV S prime:     15.20 cm/s RVOT diam:      1.70 cm TAPSE (M-mode): 2.1 cm LEFT ATRIUM             Index        RIGHT ATRIUM  Index LA diam:        4.20 cm 2.19 cm/m   RA Area:     19.10 cm LA Vol (A2C):   69.9 ml 36.41 ml/m  RA Volume:   53.30 ml  27.76 ml/m LA Vol (A4C):   87.0 ml 45.32 ml/m LA Biplane Vol: 78.0 ml 40.63 ml/m  AORTIC VALVE                     PULMONIC VALVE AV Area (Vmax):    2.36 cm      PV Area (Vmax):  2.24 cm AV Area (Vmean):   2.55 cm      PV Area (Vmean): 1.99 cm AV Area (VTI):     2.51 cm      PV Area (VTI):   2.19 cm AV Vmax:           234.00 cm/s   PV Vmax:         1.81 m/s AV Vmean:          161.000 cm/s  PV Vmean:        132.000 cm/s AV VTI:            0.378 m       PV VTI:          0.334 m AV Peak Grad:      21.9 mmHg     PV Peak grad:    13.1 mmHg AV Mean Grad:      12.0 mmHg     PV Mean grad:    8.0 mmHg LVOT Vmax:         195.00 cm/s   RVOT Peak grad:  13 mmHg LVOT Vmean:        145.000 cm/s LVOT VTI:          0.334 m LVOT/AV VTI ratio: 0.88  AORTA Ao Root diam: 2.90 cm Ao Asc diam:  3.30 cm  SHUNTS Systemic VTI:  0.33 m Systemic Diam: 1.90 cm Pulmonic VTI:  0.322 m Pulmonic Diam: 1.70 cm Qp/Qs:         0.77 Rebecca Andrews Electronically signed by Rebecca Andrews Signature Date/Time: 04/15/2024/1:30:25 PM    Final    MR BRAIN W WO CONTRAST Result Date: 04/15/2024 CLINICAL DATA:  Stroke, hemorrhagic. EXAM: MRI HEAD WITHOUT AND WITH CONTRAST TECHNIQUE: Multiplanar, multiecho pulse  sequences of the brain and surrounding structures were obtained without and with intravenous contrast. CONTRAST:  9.5mL GADAVIST  GADOBUTROL  1 MMOL/ML IV SOLN COMPARISON:  CT head and CTA head and neck 04/14/2024. FINDINGS: Brain: Pontine hemorrhage is similar to prior studies accounting for differences in modality. There is associated edema throughout the pons mildly expanded appearance of the pons with edema noted extending into the anterior aspect of the left middle cerebellar peduncle. Edema also extends into the inferior aspect of the midbrain. There are additional scattered areas of susceptibility within the bilateral basal ganglia, left thalamus, medulla, and bilateral cerebellum. Additional areas of susceptibility in the cerebral hemispheres, right greater than left. No midline shift. No restricted diffusion. The white matter is unremarkable. 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 Additional remote lacunar infarct in the left caudate. The basilar cisterns are patent. No extra-axial fluid collections. Ventricles: Normal size and configuration of the ventricles. Vascular: Skull base flow voids are visualized. Skull and upper cervical spine: No focal abnormality. Sinuses/Orbits: Orbits are symmetric. Paranasal sinuses are clear. Other: Trace fluid in the left mastoid air cells. IMPRESSION: Similar appearance of pontine hemorrhage.  Surrounding edema extending into the inferior midbrain into the left middle cerebellar peduncle. Scattered chronic microhemorrhages primarily in the basal ganglia, left thalamus, and cerebellum suggestive of hypertensive encephalopathy. Few additional chronic microhemorrhages within the cerebral hemispheres. No abnormal enhancement. 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 Electronically Signed   By: Rebecca Mania M.D.   On: 04/15/2024 13:22    Vitals:   04/16/24 0630 04/16/24 0645 04/16/24 0700 04/16/24 0804  BP: 136/78 136/82 (!) 142/86 (!) 144/89  Pulse: 64  63 65 69  Resp: (!) 32 (!) 29 (!) 33 (!) 35  Temp:   97.8 F (36.6 C)   TempSrc:   Oral   SpO2: 96% 96% 97% 96%  Weight:      Height:         PHYSICAL EXAM General:  Alert, well-nourished, well-developed patient in no acute distress Psych:  Mood and affect appropriate for situation CV: Regular rate and rhythm on monitor Respiratory:  Regular, unlabored respirations on room air GI: Abdomen soft and nontender   NEURO:  Mental Status: AA&Ox3, patient is able to give clear and coherent history Speech/Language: speech improved, with no aphasia.  Naming, repetition, fluency, and comprehension intact.  Cranial Nerves:  II: PERRL. Visual fields full.  III, IV, VI: EOMI. Nystagmus noted with right gaze. Eyelids elevate symmetrically.  V: Sensation is intact to light touch and symmetrical to face.  VII: Face is symmetrical resting and smiling VIII: hearing intact to voice. IX, X: Palate elevates symmetrically. Phonation is normal.  KP:Dynloizm shrug 5/5. XII: tongue is midline without fasciculations. Motor: Mild drift in RLE, no drift in LLE Full strength in bilateral upper extremities, no drift Tone: is normal and bulk is normal Sensation- Intact to light touch bilaterally. Extinction absent to light touch to DSS.   Coordination: FTN intact bilaterally, HKS: no ataxia in BLE.No drift.  Gait- deferred  Most Recent NIH 21     ASSESSMENT/PLAN  Rebecca Andrews is a 43 y.o. female with history of possibly undiagnosed hypertension presenting to the emergency department for evaluation of sudden onset of headache, dizziness and slurred speech that she started noticing somewhere around 5:30 PM. Her blood pressures were found to be in the systolic 230s. Head CT was performed as she had also complained of numbness in her upper extremities and weakness on the right side. Head CT revealed a pontine hemorrhage.  NIH on Admission 2  ICH:   Pontine ICH, etiology:  Hypertension  Code Stroke  CT head - Acute pontine hemorrhage measuring up to 2 cm.  CTA head & neck No aneurysm or AVM. No LVO CT Head- Stable to slightly decreased size of the pontine hemorrhage, measuring 2.4 x 1.7 cm. MRI  Similar appearance of pontine hemorrhage. Surrounding edema extending into the inferior midbrain into the left middle cerebellar peduncle. Scattered chronic microhemorrhages primarily in the basal ganglia, left thalamus, and cerebellum suggestive of hypertensive encephalopathy.   2D Echo - Flow acceleration throughout the left ventricle consistent with hyperdynamic function and LVH. 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 Left ventricular ejection fraction, by estimation, is >75%.      LDL 97 HgbA1c 5.2 UDS negative VTE prophylaxis - Heparin  subq No antithrombotic prior to admission, now on No antithrombotic Therapy recommendations:  outpatient PT Disposition:  pending  Hypertensive emergency Home meds:  None Presented with extremely high BP IV Cleviprex   off Start Norvasc  10 and PO labetalol  200mg  tid today Stable BP goal < 160 Long-term BP goal normotensive  Hyperlipidemia LDL 97, goal < 70 Consider to start statin at discharge  Other Stroke Risk Factors Obesity, Body mass index is 36.95 kg/m., BMI >/= 30 associated with increased stroke risk, recommend weight loss, diet and exercise as appropriate   Other medical issues Hypokalemia, K 3.4, supplement AKI- 500cc bolus, repeat BMP tomorrow Cr 0.77 -> 1.38 Encourage PO fluids   Hospital day # 2   Patient seen and examined by NP/APP with MD. MD to update note as needed.   Jorene Last, DNP, FNP-BC [facility] Pager: [phone]  I have personally obtained history,examined this patient, reviewed notes, independently viewed imaging studies, participated in medical decision making and plan of care.ROS completed by me  personally and pertinent positives fully documented  I have made any additions or clarifications directly to the above note. Agree with note above.  Patient's neurological exam remains stable.  Blood pressure adequately controlled and now off Cleviprex  drip.  Continue ongoing therapies.  Mobilize out of bed.  Consider transfer out of ICU when bed available.  No family at the bedside.  I personally spent a total of 50 minutes in the care of the patient today including getting/reviewing separately obtained history, performing a medically appropriate exam/evaluation, counseling and educating, placing orders, referring and communicating with other health care professionals, documenting clinical information in the EHR, independently interpreting results, and coordinating care.         Eather Popp, MD Medical Director [facility] Pager: [phone] 04/16/2024 4:40 PM   After hours, contact General Neurology

## 2024-04-17 DIAGNOSIS — G936 Cerebral edema: Secondary | ICD-10-CM | POA: Diagnosis not present

## 2024-04-17 DIAGNOSIS — I161 Hypertensive emergency: Secondary | ICD-10-CM | POA: Diagnosis not present

## 2024-04-17 DIAGNOSIS — I613 Nontraumatic intracerebral hemorrhage in brain stem: Secondary | ICD-10-CM | POA: Diagnosis not present

## 2024-04-17 LAB — BASIC METABOLIC PANEL WITH GFR
Anion gap: 8 (ref 5–15)
BUN: 14 mg/dL (ref 6–20)
CO2: 24 mmol/L (ref 22–32)
Calcium: 8.9 mg/dL (ref 8.9–10.3)
Chloride: 103 mmol/L (ref 98–111)
Creatinine, Ser: 1.09 mg/dL — ABNORMAL HIGH (ref 0.44–1.00)
GFR, Estimated: >60 mL/min (ref >60)
Glucose, Bld: 95 mg/dL (ref 70–99)
Potassium: 4.1 mmol/L (ref 3.5–5.1)
Sodium: 135 mmol/L (ref 135–145)

## 2024-04-17 LAB — CBC
HCT: 31 % — ABNORMAL LOW (ref 36.0–46.0)
Hemoglobin: 9.8 g/dL — ABNORMAL LOW (ref 12.0–15.0)
MCH: 25.9 pg — ABNORMAL LOW (ref 26.0–34.0)
MCHC: 31.6 g/dL (ref 30.0–36.0)
MCV: 81.8 fL (ref 80.0–100.0)
Platelets: 375 K/uL (ref 150–400)
RBC: 3.79 MIL/uL — ABNORMAL LOW (ref 3.87–5.11)
RDW: 17.6 % — ABNORMAL HIGH (ref 11.5–15.5)
WBC: 5.6 K/uL (ref 4.0–10.5)
nRBC: 0 % (ref 0.0–0.2)

## 2024-04-17 NOTE — Plan of Care (Signed)
  Problem: Self-Care: Goal: Ability to participate in self-care as condition permits will improve Outcome: Progressing Goal: Verbalization of feelings and concerns over difficulty with self-care will improve Outcome: Progressing Goal: Ability to communicate needs accurately will improve Outcome: Progressing   Problem: Nutrition: Goal: Risk of aspiration will decrease Outcome: Progressing Goal: Dietary intake will improve Outcome: Progressing   Problem: Clinical Measurements: Goal: Ability to maintain clinical measurements within normal limits will improve Outcome: Progressing Goal: Will remain free from infection Outcome: Progressing Goal: Diagnostic test results will improve Outcome: Progressing Goal: Respiratory complications will improve Outcome: Progressing Goal: Cardiovascular complication will be avoided Outcome: Progressing   Problem: Activity: Goal: Risk for activity intolerance will decrease Outcome: Progressing   Problem: Elimination: Goal: Will not experience complications related to bowel motility Outcome: Progressing Goal: Will not experience complications related to urinary retention Outcome: Progressing   Problem: Pain Managment: Goal: General experience of comfort will improve and/or be controlled Outcome: Progressing   Problem: Safety: Goal: Ability to remain free from injury will improve Outcome: Progressing   Problem: Skin Integrity: Goal: Risk for impaired skin integrity will decrease Outcome: Progressing

## 2024-04-17 NOTE — Plan of Care (Signed)
 Did discuss changing eating habits and other things and ways to make small changes to help promote her health and keep this from happening again.   Problem: Education: Goal: Knowledge of disease or condition will improve Outcome: Progressing  Problem: Education: Goal: Knowledge of secondary prevention will improve (MUST DOCUMENT ALL) Outcome: Progressing   Problem: Education: Goal: Knowledge of patient specific risk factors will improve (DELETE if not current risk factor) Outcome: Progressing   Problem: Nutrition: Goal: Risk of aspiration will decrease Outcome: Progressing   Problem: Nutrition: Goal: Dietary intake will improve Outcome: Progressing

## 2024-04-17 NOTE — Progress Notes (Signed)
 PROGRESS NOTE    Rebecca Andrews  FMW:[id number] DOB: 1981/03/08 DOA: 04/14/2024 PCP: Patient, No Pcp Per  Outpatient Specialists:     Brief Narrative:  Patient is a 43 year old African female with past medical history significant for hypertension for approximately 17 years.  Currently, patient has not followed up closely.  Patient developed headache, with associated nausea and vomiting, and was found to have systolic blood pressure of 230 mmHg.  Patient was also worried that she could be having some stroke symptoms, but could not describe further.  CT head done revealed pontine hemorrhage.  Patient has been worked up extensively by the neurology team.  [facility] team assumed care today, 04/17/2024.  Neurology team has also cleared patient for discharge, however, patient will need outpatient follow-up arranged.  Will consult TOC.  04/17/2024: Patient seen.  No new symptoms today.  Blood pressure is reasonably controlled, ranging from 134-162/87 mmHg.  Patient is currently on amlodipine  10 Mg p.o. once daily and labetalol  200 Mg p.o. 3 times daily.   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  Patient has been noncompliant. - CT head revealed acute pontine hemorrhage. - MRI brain also revealed pontine hemorrhage. - CT angiography head and neck with and without contrast revealed no aneurysm or AV malformation, no evidence of active extravasation, and no large vessel occlusion or proximal hemodynamically significant stenosis. - Neurology team is assisting in directing patient's care. - Likely discharge tomorrow. - Need to comply with management discussed with patient extensively.  Malignant hypertension/noncompliance: -See above documentation. - Blood pressure control is improved. - Continue amlodipine  and labetalol . - May consider changing labetalol  to twice daily to enhance  compliance. -Low potassium on presentation.  Check Aldo/renin ratio to rule out primary aldosteronism. - Check metanephrines. - Doppler ultrasound of the renal arteries  -Check metanephrines. - Patient will need PCP follow-up on discharge.  Hypokalemia: - Potassium of 3.3 on presentation. - Potassium is 4.1 today. - Check aldosterone/renin ratio to rule out primary hyperaldosteronism.  Acute kidney injury: - Resolving. - Serum creatinine of 0.77 on presentation, increased to 1.38, and 9 down to 1.09. - Likely secondary to hemodynamic instability. - Cannot component of prerenal etiology - Urine protein of 30 noted, check urine protein/creatinine ratio.  Last UPC done in 2013 was suggestive of 1.15 g proteinuria. - Will check ANA. - Monitor renal function and electrolytes close - Detailed family history is necessary.  Anemia: - Hemoglobin of 9.8 g/dL, MCV of 18.1. - Check iron level.  Rule out iron deficiency anemia.  Proteinuria: - See above documentation. - Check ANA. - Urine protein creatinine ratio. - ACE inhibitor or ARB when AKI resolves completely. - Depending on quantity of proteinuria, may consider SGLT2 inhibitor when renal function returns to normal.   DVT prophylaxis: SCD. Code Status: Full code. Family Communication:  Disposition Plan: Inpatient.   Consultants:  Neurology.  Procedures:  None.  Antimicrobials:  None.   Subjective: No new complaints.  Objective: Vitals:   04/17/24 0343 04/17/24 0758 04/17/24 0818 04/17/24 1207  BP: (!) 153/82 (!) 156/80 (!) 148/84 (!) 162/87  Pulse: 74 69  71  Resp: 18 18  20   Temp: 99.3 F (37.4 C) 99.3 F (37.4 C)  98.1 F (36.7 C)  TempSrc: Oral Oral  Oral  SpO2: 98% 98%  100%  Weight:      Height:        Intake/Output Summary (  Last 24 hours) at 04/17/2024 1403 Last data filed at 04/17/2024 0345 Gross per 24 hour  Intake --  Output 500 ml  Net -500 ml   Filed Weights   04/14/24 1900  Weight: 91.6  kg    Examination:  General exam: Appears calm and comfortable  Respiratory system: Clear to auscultation. Respiratory effort normal. Cardiovascular system: S1 & S2 heard. Gastrointestinal system: Abdomen is soft and nontender. Central nervous system: Alert and oriented. . Extremities: No leg edema.  Data Reviewed: I have personally reviewed following labs and imaging studies  CBC: Recent Labs  Lab 04/14/24 1917 04/15/24 0650 04/16/24 0404 04/17/24 0414  WBC 7.4 8.0 5.6 5.6  NEUTROABS 5.2  --   --   --   HGB 10.2* 10.5* 9.6* 9.8*  HCT 33.7* 33.1* 30.9* 31.0*  MCV 83.8 81.5 83.5 81.8  PLT 398 405* 363 375   Basic Metabolic Panel: Recent Labs  Lab 04/14/24 1917 04/15/24 0650 04/16/24 0404 04/17/24 0414  NA 135 138 136 135  K 3.3* 3.4* 3.9 4.1  CL 101 104 104 103  CO2 22 23 22 24   GLUCOSE 112* 117* 98 95  BUN 13 7 9 14   CREATININE 0.90 0.77 1.38* 1.09*  CALCIUM 8.8* 9.0 9.0 8.9  MG  --   --  1.9  --   PHOS  --   --  4.7*  --    GFR: Estimated Creatinine Clearance: 72.2 mL/min (A) (by C-G formula based on SCr of 1.09 mg/dL (H)). Liver Function Tests: Recent Labs  Lab 04/14/24 1917 04/15/24 0650  AST 19 31  ALT 13 13  ALKPHOS 62 58  BILITOT 0.6 0.6  PROT 8.0 7.8  ALBUMIN 3.5 3.2*   Recent Labs  Lab 04/14/24 1917  LIPASE 30   No results for input(s): AMMONIA in the last 168 hours. Coagulation Profile: Recent Labs  Lab 04/15/24 0650  INR 1.1   Cardiac Enzymes: No results for input(s): CKTOTAL, CKMB, CKMBINDEX, TROPONINI in the last 168 hours. BNP (last 3 results) No results for input(s): PROBNP in the last 8760 hours. HbA1C: Recent Labs    04/15/24 0650  HGBA1C 5.2   CBG: No results for input(s): GLUCAP in the last 168 hours. Lipid Profile: Recent Labs    04/15/24 0650  CHOL 163  HDL 56  LDLCALC 97  TRIG 51  CHOLHDL 2.9   Thyroid Function Tests: No results for input(s): TSH, T4TOTAL, FREET4, T3FREE,  THYROIDAB in the last 72 hours. Anemia Panel: No results for input(s): VITAMINB12, FOLATE, FERRITIN, TIBC, IRON, RETICCTPCT in the last 72 hours. Urine analysis:    Component Value Date/Time   COLORURINE YELLOW 04/14/2024 1917   APPEARANCEUR CLOUDY (A) 04/14/2024 1917   LABSPEC 1.013 04/14/2024 1917   PHURINE 7.0 04/14/2024 1917   GLUCOSEU NEGATIVE 04/14/2024 1917   HGBUR NEGATIVE 04/14/2024 1917   BILIRUBINUR NEGATIVE 04/14/2024 1917   KETONESUR NEGATIVE 04/14/2024 1917   PROTEINUR 30 (A) 04/14/2024 1917   UROBILINOGEN 1.0 06/19/2010 1314   NITRITE NEGATIVE 04/14/2024 1917   LEUKOCYTESUR NEGATIVE 04/14/2024 1917   Sepsis Labs: @LABRCNTIP (procalcitonin:4,lacticidven:4)  ) Recent Results (from the past 240 hours)  MRSA Next Gen by PCR, Nasal     Status: None   Collection Time: 04/15/24 12:53 AM   Specimen: Nasal Swab  Result Value Ref Range Status   MRSA by PCR Next Gen NOT DETECTED NOT DETECTED Final    Comment: (NOTE) The GeneXpert MRSA Assay (FDA approved for NASAL specimens only), is  one component of a comprehensive MRSA colonization surveillance program. It is not intended to diagnose MRSA infection nor to guide or monitor treatment for MRSA infections. Test performance is not FDA approved in patients less than 42 years old. Performed at [facility], [address] [place], [place] [address]          Radiology Studies: No results found.      Scheduled Meds:  amLODipine   10 mg Oral Daily   Chlorhexidine  Gluconate Cloth  6 each Topical Daily   heparin  injection (subcutaneous)  5,000 Units Subcutaneous Q8H   labetalol   200 mg Oral TID   pantoprazole   40 mg Oral QHS   senna-docusate  1 tablet Oral BID   Continuous Infusions:   LOS: 3 days    Time spent: 55 minutes.    Leatrice Chapel, MD  [facility] Pager #: [phone] 7PM-7AM contact night coverage as above

## 2024-04-17 NOTE — Progress Notes (Signed)
 STROKE TEAM PROGRESS NOTE    SIGNIFICANT HOSPITAL EVENTS 7/25- admitted with ICH  INTERIM HISTORY/SUBJECTIVE Patient has not been transferred to neurology stepdown bed.  Neuro exam stable today. Able to eat, denies headache, nausea, or dizziness.    BP adequately controlled.  Neurological exam  without focal deficits Patient wants to go home.  Therapies recommend outpatient therapies.  Patient does not have a primary care physician and will need to be set up with someone. OBJECTIVE  CBC    Component Value Date/Time   WBC 5.6 04/17/2024 0414   RBC 3.79 (L) 04/17/2024 0414   HGB 9.8 (L) 04/17/2024 0414   HCT 31.0 (L) 04/17/2024 0414   PLT 375 04/17/2024 0414   MCV 81.8 04/17/2024 0414   MCH 25.9 (L) 04/17/2024 0414   MCHC 31.6 04/17/2024 0414   RDW 17.6 (H) 04/17/2024 0414   LYMPHSABS 1.4 04/14/2024 1917   MONOABS 0.7 04/14/2024 1917   EOSABS 0.0 04/14/2024 1917   BASOSABS 0.0 04/14/2024 1917    BMET    Component Value Date/Time   NA 135 04/17/2024 0414   K 4.1 04/17/2024 0414   CL 103 04/17/2024 0414   CO2 24 04/17/2024 0414   GLUCOSE 95 04/17/2024 0414   BUN 14 04/17/2024 0414   CREATININE 1.09 (H) 04/17/2024 0414   CALCIUM 8.9 04/17/2024 0414   GFRNONAA >60 04/17/2024 0414    IMAGING past 24 hours No results found.   Vitals:   04/16/24 2353 04/17/24 0343 04/17/24 0758 04/17/24 0818  BP: (!) 150/79 (!) 153/82 (!) 156/80 (!) 148/84  Pulse: 73 74 69   Resp: 20 18 18    Temp: 98.9 F (37.2 C) 99.3 F (37.4 C) 99.3 F (37.4 C)   TempSrc: Oral Oral Oral   SpO2: 97% 98% 98%   Weight:      Height:         PHYSICAL EXAM General:  Alert, well-nourished, well-developed patient in no acute distress Psych:  Mood and affect appropriate for situation CV: Regular rate and rhythm on monitor Respiratory:  Regular, unlabored respirations on room air GI: Abdomen soft and nontender   NEURO:  Mental Status: AA&Ox3, patient is able to give clear and coherent  history Speech/Language: speech improved, with no aphasia.  Naming, repetition, fluency, and comprehension intact.  Cranial Nerves:  II: PERRL. Visual fields full.  III, IV, VI: EOMI. Nystagmus noted with right gaze. Eyelids elevate symmetrically.  V: Sensation is intact to light touch and symmetrical to face.  VII: Face is symmetrical resting and smiling VIII: hearing intact to voice. IX, X: Palate elevates symmetrically. Phonation is normal.  KP:Dynloizm shrug 5/5. XII: tongue is midline without fasciculations. Motor: Mild drift in RLE, no drift in LLE Full strength in bilateral upper extremities, no drift Tone: is normal and bulk is normal Sensation- Intact to light touch bilaterally. Extinction absent to light touch to DSS.   Coordination: FTN intact bilaterally, HKS: no ataxia in BLE.No drift.  Gait- deferred  Most Recent NIH 21     ASSESSMENT/PLAN  Ms. Rebecca Andrews is a 43 y.o. female with history of possibly undiagnosed hypertension presenting to the emergency department for evaluation of sudden onset of headache, dizziness and slurred speech that she started noticing somewhere around 5:30 PM. Her blood pressures were found to be in the systolic 230s. Head CT was performed as she had also complained of numbness in her upper extremities and weakness on the right side. Head CT revealed a pontine hemorrhage.  NIH on Admission 2  ICH:   Pontine ICH, etiology:  Hypertension  Code Stroke CT head - Acute pontine hemorrhage measuring up to 2 cm.  CTA head & neck No aneurysm or AVM. No LVO CT Head- Stable to slightly decreased size of the pontine hemorrhage, measuring 2.4 x 1.7 cm. MRI  Similar appearance of pontine hemorrhage. Surrounding edema extending into the inferior midbrain into the left middle cerebellar peduncle. Scattered chronic microhemorrhages primarily in the basal ganglia, left thalamus, and cerebellum suggestive of hypertensive encephalopathy.   2D Echo - Flow  acceleration throughout the left ventricle consistent with hyperdynamic function and LVH. 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 Left ventricular ejection fraction, by estimation, is >75%.      LDL 97 HgbA1c 5.2 UDS negative VTE prophylaxis - Heparin  subq No antithrombotic prior to admission, now on No antithrombotic Therapy recommendations:  outpatient PT Disposition:  pending  Hypertensive emergency Home meds:  None Presented with extremely high BP IV Cleviprex  off Start Norvasc  10 and PO labetalol  200mg  tid today Stable BP goal < 160 Long-term BP goal normotensive  Hyperlipidemia LDL 97, goal < 70 Consider to start statin at discharge  Other Stroke Risk Factors Obesity, Body mass index is 36.95 kg/m., BMI >/= 30 associated with increased stroke risk, recommend weight loss, diet and exercise as appropriate   Other medical issues Hypokalemia, K 3.4, supplement AKI- 500cc bolus, repeat BMP tomorrow Cr 0.77 -> 1.38 Encourage PO fluids   Hospital day # 3   Patient's neurological exam remains stable.  Blood pressure adequately controlled on Norvasc  and labetalol ..  Therapist recommend outpatient therapies.  Patient does not have a primary care physician.  She will need referral to start seeing someone soon after discharge.  Discussed with Dr. Rosario.  I personally spent a total of 35 minutes in the care of the patient today including getting/reviewing separately obtained history, performing a medically appropriate exam/evaluation, counseling and educating, placing orders, referring and communicating with other health care professionals, documenting clinical information in the EHR, independently interpreting results, and coordinating care.    Stroke team will sign off.  Kindly call for questions.Follow-up with an outpatient stroke clinic in 2 months with nurse practitioner  Eather Popp, MD Medical Director [facility] Pager: [phone] 04/17/2024 11:29 AM   After hours, contact General Neurology

## 2024-04-18 ENCOUNTER — Inpatient Hospital Stay (HOSPITAL_COMMUNITY)

## 2024-04-18 ENCOUNTER — Other Ambulatory Visit (HOSPITAL_COMMUNITY): Payer: Self-pay

## 2024-04-18 DIAGNOSIS — I150 Renovascular hypertension: Secondary | ICD-10-CM

## 2024-04-18 LAB — CBC
HCT: 32.8 % — ABNORMAL LOW (ref 36.0–46.0)
Hemoglobin: 10.3 g/dL — ABNORMAL LOW (ref 12.0–15.0)
MCH: 26.1 pg (ref 26.0–34.0)
MCHC: 31.4 g/dL (ref 30.0–36.0)
MCV: 83 fL (ref 80.0–100.0)
Platelets: 360 K/uL (ref 150–400)
RBC: 3.95 MIL/uL (ref 3.87–5.11)
RDW: 17.7 % — ABNORMAL HIGH (ref 11.5–15.5)
WBC: 5.7 K/uL (ref 4.0–10.5)
nRBC: 0 % (ref 0.0–0.2)

## 2024-04-18 LAB — BASIC METABOLIC PANEL WITH GFR
Anion gap: 10 (ref 5–15)
BUN: 13 mg/dL (ref 6–20)
CO2: 20 mmol/L — ABNORMAL LOW (ref 22–32)
Calcium: 8.6 mg/dL — ABNORMAL LOW (ref 8.9–10.3)
Chloride: 103 mmol/L (ref 98–111)
Creatinine, Ser: 0.96 mg/dL (ref 0.44–1.00)
GFR, Estimated: >60 mL/min (ref >60)
Glucose, Bld: 96 mg/dL (ref 70–99)
Potassium: 4.5 mmol/L (ref 3.5–5.1)
Sodium: 133 mmol/L — ABNORMAL LOW (ref 135–145)

## 2024-04-18 MED ORDER — SENNOSIDES-DOCUSATE SODIUM 8.6-50 MG PO TABS
1.0000 | ORAL_TABLET | Freq: Two times a day (BID) | ORAL | 0 refills | Status: DC
Start: 2024-04-18 — End: 2024-05-05
  Filled 2024-04-18: qty 20, 10d supply, fill #0

## 2024-04-18 MED ORDER — AMLODIPINE BESYLATE 10 MG PO TABS
10.0000 mg | ORAL_TABLET | Freq: Every day | ORAL | 0 refills | Status: DC
  Filled 2024-04-18: qty 30, 30d supply, fill #0

## 2024-04-18 MED ORDER — PANTOPRAZOLE SODIUM 40 MG PO TBEC
40.0000 mg | DELAYED_RELEASE_TABLET | Freq: Every day | ORAL | 0 refills | Status: DC
  Filled 2024-04-18: qty 30, 30d supply, fill #0

## 2024-04-18 MED ORDER — LABETALOL HCL 300 MG PO TABS
300.0000 mg | ORAL_TABLET | Freq: Three times a day (TID) | ORAL | 0 refills | Status: DC
  Filled 2024-04-18: qty 90, 30d supply, fill #0

## 2024-04-18 MED ORDER — LISINOPRIL 20 MG PO TABS
20.0000 mg | ORAL_TABLET | Freq: Every day | ORAL | 0 refills | Status: DC
  Filled 2024-04-18: qty 30, 30d supply, fill #0

## 2024-04-18 NOTE — Discharge Summary (Addendum)
 Physician Discharge Summary  Patient ID: Rebecca Andrews MRN: [id number] DOB/AGE: [birth year] 41 y.o.  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  Apparently, patient had not followed up closely.  Patient developed headache, with associated nausea and vomiting, and was found to have systolic blood pressure of 230 mmHg.  Patient was worried that she was stroke symptoms, but could not describe further.  CT head done on presentation revealed pontine hemorrhage.  Patient was worked up extensively by the neurology team.  [facility] hospitalist team assumed care today, 04/17/2024, to assist in arranging Primary care provider on discharge!!  Neurology team has cleared patient for discharge.  Patient will need outpatient follow-up arranged by Transition of care of team.  Blood pressure has remained reasonably controlled.  Hypokalemia was noted on presentation.  There will be need to rule out primary hyperaldosteronism (Aldosterone/Renin Ratio).  Doppler ultrasound of the renal arteries was non revealing.  PCP may consider checking plasma metanephrines.  No family history of brain bleeds endorsed (PCP can follow this up as well).      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  Patient had been noncompliant, and was never really followed up by a Provider. - CT head revealed acute pontine hemorrhage. - MRI brain revealed pontine hemorrhage. - CT angiography head and neck with and without contrast revealed no aneurysm or AV malformation, no evidence of active extravasation, and no large vessel occlusion or proximal hemodynamically significant stenosis. - Neurology team directed  patient's care (see above documentation). - Need to comply with management discussed with patient extensively.   Malignant hypertension/noncompliance: -See above documentation. - Blood pressure control has improved. - Continue amlodipine  and labetalol . - Will add Lisinopril  as renal function is improving. -If necessary, PCP may consider adding a diuretic (improving/improved AKI noted)  -Low potassium on presentation.  Check/follow Aldosterone/renin ratio to rule out primary aldosteronism. - Consider Checking plasma metanephrines. - Doppler ultrasound of the renal arteries came back normal - Patient will need PCP follow-up on discharge. -Goal BP should be less than 130/80 mmHg.   Hypokalemia: - Potassium of 3.3 on presentation. - Potassium prior to discharge was 4.5. -Continue to monitor renal function and electrolytes closely. - Check/follow aldosterone/renin ratio to rule out primary hyperaldosteronism.   Acute kidney injury: - Resolved. - Serum creatinine of 0.77 on presentation, peaked at 1.38, and down to 0.96 prior to discharge. - Likely secondary to hemodynamic instability. - Cannot rule out component of prerenal etiology. - Urine protein of 30 noted, check urine protein/creatinine ratio.  Last UPC done in 2013 was suggestive of 1.15 g proteinuria. - If indicated, consider checking ANA etc. - Monitor renal function and electrolytes close - Detailed family history is necessary.   Anemia: - Hemoglobin of 9.8 g/dL, MCV of 18.1. - Check iron level.  Rule out iron deficiency anemia.   Proteinuria: - See above documentation. - Check ANA. - Urine protein creatinine ratio. - Lisinopril  added prior to discharge. - Depending on quantity of proteinuria, may consider SGLT2 inhibitor i.e. if indicated.   Consults: None.  Patient was admitted and managed by the Neurology team and later transferred to the Hospitalist team.  Significant Diagnostic Studies:  MRI HEAD WITHOUT AND  WITH CONTRAST   TECHNIQUE: Multiplanar, multiecho pulse  sequences of the brain and surrounding structures were obtained without and with intravenous contrast.   CONTRAST:  9.5mL GADAVIST  GADOBUTROL  1 MMOL/ML IV SOLN   COMPARISON:  CT head and CTA head and neck 04/14/2024.   FINDINGS: Brain: Pontine hemorrhage is similar to prior studies accounting for differences in modality. There is associated edema throughout the pons mildly expanded appearance of the pons with edema noted extending into the anterior aspect of the left middle cerebellar peduncle. Edema also extends into the inferior aspect of the midbrain. There are additional scattered areas of susceptibility within the bilateral basal ganglia, left thalamus, medulla, and bilateral cerebellum. Additional areas of susceptibility in the cerebral hemispheres, right greater than left. No midline shift. No restricted diffusion. The white matter is unremarkable. 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 Additional remote lacunar infarct in the left caudate. The basilar cisterns are patent. No extra-axial fluid collections.   Ventricles: Normal size and configuration of the ventricles.   Vascular: Skull base flow voids are visualized.   Skull and upper cervical spine: No focal abnormality.   Sinuses/Orbits: Orbits are symmetric. Paranasal sinuses are clear.   Other: Trace fluid in the left mastoid air cells.   IMPRESSION: Similar appearance of pontine hemorrhage. Surrounding edema extending into the inferior midbrain into the left middle cerebellar peduncle.   Scattered chronic microhemorrhages primarily in the basal ganglia, left thalamus, and cerebellum suggestive of hypertensive encephalopathy. Few additional chronic microhemorrhages within the cerebral hemispheres.   No abnormal enhancement.   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     Electronically Signed   By: Donnice Mania M.D.   On:  04/15/2024 13:22   CT Head without contrast revealed: Acute pontine hemorrhage measuring up to 2 cm.    CT ANGIOGRAPHY HEAD AND NECK WITH AND WITHOUT CONTRAST   TECHNIQUE: Multidetector CT imaging of the head and neck was performed using the standard protocol during bolus administration of intravenous contrast. Multiplanar CT image reconstructions and MIPs were obtained to evaluate the vascular anatomy. Carotid stenosis measurements (when applicable) are obtained utilizing NASCET criteria, using the distal internal carotid diameter as the denominator.   RADIATION DOSE REDUCTION: This exam was performed according to the departmental dose-optimization program which includes automated exposure control, adjustment of the mA and/or kV according to patient size and/or use of iterative reconstruction technique.   CONTRAST:  75mL OMNIPAQUE  IOHEXOL  350 MG/ML SOLN   COMPARISON:  Same day CT head.   FINDINGS: CTA NECK FINDINGS   Aortic arch: Great vessel origins are patent without significant stenosis.   Right carotid system: No evidence of dissection, stenosis (50% or greater), or occlusion.   Left carotid system: No evidence of dissection, stenosis (50% or greater), or occlusion.   Vertebral arteries: Codominant. No evidence of dissection, stenosis (50% or greater), or occlusion.   Skeleton: No evidence of acute abnormality.   Other neck: No evidence of acute abnormality.   Upper chest: Visualized lung apices are clear.   Review of the MIP images confirms the above findings   CTA HEAD FINDINGS   Anterior circulation: Bilateral intracranial ICAs, MCAs, and ACAs are patent without proximal hemodynamically significant stenosis.   Posterior circulation: Bilateral intradural vertebral arteries, basilar artery and bilateral posterior cerebral arteries are patent without proximal hemodynamically significant stenosis. No aneurysm arteriovenous malformation identified. No  blush of contrast to suggest active extravasation.   Venous sinuses: As permitted by contrast timing, patent.   Review of the MIP images confirms the above  findings   IMPRESSION: 1. No aneurysm or arteriovenous malformation identified. No evidence of active extravasation. 2. No large vessel occlusion or proximal hemodynamically significant stenosis.     Electronically Signed   By: Gilmore GORMAN Molt M.D.   On: 04/15/2024 02:02    Treatments:  Blood pressure was controlled  Discharge Exam: Blood pressure (!) 156/83, pulse 68, temperature 98.6 F (37 C), temperature source Oral, resp. rate 18, height 5' 2 (1.575 m), weight 91.6 kg, SpO2 99%, unknown if currently breastfeeding.   Disposition: Discharge disposition: 01-Home or Self Care       Discharge Instructions     Ambulatory referral to Occupational Therapy   Complete by: As directed    Ambulatory referral to Physical Therapy   Complete by: As directed    Ambulatory referral to Speech Therapy   Complete by: As directed    Diet - low sodium heart healthy   Complete by: As directed    Increase activity slowly   Complete by: As directed       Allergies as of 04/18/2024   No Known Allergies      Medication List     TAKE these medications    acetaminophen  500 MG tablet Commonly known as: TYLENOL  Take 1,000 mg by mouth every 6 (six) hours as needed for mild pain (pain score 1-3) or headache.   amLODipine  10 MG tablet Commonly known as: NORVASC  Take 1 tablet (10 mg total) by mouth daily. Start taking on: April 19, 2024   labetalol  300 MG tablet Commonly known as: NORMODYNE  Take 1 tablet (300 mg total) by mouth 3 (three) times daily.   lisinopril  20 MG tablet Commonly known as: ZESTRIL  Take 1 tablet (20 mg total) by mouth daily.   pantoprazole  40 MG tablet Commonly known as: PROTONIX  Take 1 tablet (40 mg total) by mouth at bedtime.   senna-docusate 8.6-50 MG tablet Commonly known as:  Senokot-S Take 1 tablet by mouth 2 (two) times daily.               Durable Medical Equipment  (From admission, onward)           Start     Ordered   04/18/24 1409  For home use only DME Walker rolling  Once       Question Answer Comment  Walker: With 5 Inch Wheels   Patient needs a walker to treat with the following condition Weakness      04/18/24 1408   04/18/24 1409  For home use only DME Bedside commode  Once       Question:  Patient needs a bedside commode to treat with the following condition  Answer:  Weakness   04/18/24 1409            Follow-up Information     [facility] Follow up on 06/06/2024.   Why: your appointment is at 10:00 am. Please arrive early and bring a picture ID, insurance card and current medications.  the office will contact you if able to get you in for a sooner appointment. Contact information: [address] [place], [place] [address] Phone: [phone]        [facility] at [facility] Follow up.   Specialty: Rehabilitation Why: The outpatient rehab will contact you for the first appointment Contact information: [address] [place]  [phone]  Time spent: 35 Minutes.  SignedBETHA Leatrice LILLETTE Rosario 04/18/2024, 4:50 PM

## 2024-04-18 NOTE — TOC Initial Note (Signed)
 Transition of Care [facility]) - Initial/Assessment Note    Patient Details  Name: Rebecca Andrews MRN: [id number] Date of Birth: 03/22/1981  Transition of Care [facility]) CM/SW Contact:    Andrez JULIANNA George, RN Phone Number: 04/18/2024, 2:18 PM  Clinical Narrative:                  Pt is from home with her boyfriend and children.   No PCP. New PCP appt on the AVS. The office will also contact her for a sooner appt if becomes available.   She denies issues with home medications.  She was driving but her boyfriend or sister can provide needed transportation.  Walker/ BSC ordered through [employer] and will be delivered to the room.  IP Care Management following.  Expected Discharge Plan: OP Rehab Barriers to Discharge: Continued Medical Work up   Patient Goals and CMS Choice     Choice offered to / list presented to : Patient      Expected Discharge Plan and Services   Discharge Planning Services: CM Consult   Living arrangements for the past 2 months: Single Family Home                 DME Arranged: Bedside commode, Walker rolling DME Agency: [employer] Date DME Agency Contacted: 04/18/24   Representative spoke with at DME Agency: Darlyn            Prior Living Arrangements/Services Living arrangements for the past 2 months: Single Family Home Lives with:: Minor Children, Significant Other Patient language and need for interpreter reviewed:: Yes Do you feel safe going back to the place where you live?: Yes        Care giver support system in place?: Yes (comment)   Criminal Activity/Legal Involvement Pertinent to Current Situation/Hospitalization: No - Comment as needed  Activities of Daily Living   ADL Screening (condition at time of admission) Independently performs ADLs?: Yes (appropriate for developmental age) Is the patient deaf or have difficulty hearing?: No Does the patient have difficulty seeing, even when wearing glasses/contacts?: No Does the patient  have difficulty concentrating, remembering, or making decisions?: No  Permission Sought/Granted                  Emotional Assessment Appearance:: Appears stated age Attitude/Demeanor/Rapport: Engaged Affect (typically observed): Accepting Orientation: : Oriented to Self, Oriented to Place, Oriented to  Time, Oriented to Situation   Psych Involvement: No (comment)  Admission diagnosis:  Pontine hemorrhage (HCC) [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.3] Intracranial hemorrhage (HCC) [I62.9] 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   ESSENTIAL HYPERTENSION, BENIGN 09/02/2010   PCP:  Patient, No Pcp Per Pharmacy:   [id number] [place], [place] - [address] [facility] [address] [place] [address] Phone: [phone] Fax: [phone]  [id number] [place], [place] - [address] [place] [address] Phone: [phone] Fax: [phone]     Social Drivers of Health (SDOH) Social History: SDOH Screenings   Food Insecurity: Patient Declined (04/15/2024)  Housing: Patient Unable To Answer (04/15/2024)  Transportation Needs: Patient Unable To Answer (04/15/2024)  Utilities: Patient Unable To Answer (04/15/2024)  Tobacco Use: Medium Risk (04/14/2024)   SDOH Interventions:     Readmission Risk Interventions     No data to display

## 2024-04-18 NOTE — Progress Notes (Signed)
 Discharge instructions (including medications) discussed with and copy provided to patient, patient verbalized understanding. PIV x2 removed and patient dressed herself.   TOC medications and walker given to patient, plan for [facility] to be delivered to home per case management.  Patient partner on the way to transport her home.

## 2024-04-18 NOTE — Progress Notes (Signed)
    Durable Medical Equipment  (From admission, onward)           Start     Ordered   04/18/24 1409  For home use only DME Walker rolling  Once       Question Answer Comment  Walker: With 5 Inch Wheels   Patient needs a walker to treat with the following condition Weakness      04/18/24 1408   04/18/24 1409  For home use only DME Bedside commode  Once       Question:  Patient needs a bedside commode to treat with the following condition  Answer:  Weakness   04/18/24 1409            BSC: The patient is confined to one level of the home environment and there is no toilet on the that level of the home.

## 2024-04-18 NOTE — Progress Notes (Signed)
 Occupational Therapy Treatment Patient Details Name: Rebecca Andrews MRN: [id number] DOB: Nov 04, 1980 Today's Date: 04/18/2024   History of present illness 43 y.o. female presents to [facility] hospital on 04/14/2024 with sudden onset HA, dizziness and slurred speech. Head CT reveals pontine hemorrhage. PMH includes HTN.   OT comments  Pt progressing toward goals, focus of session on BUE therex. Pt provided with squeeze ball and theraputty, along with fine motor coordination/theraputty handouts. Pt able to demo all exercises, still presents with coordination deficits/weakness in dominant RUE vs LUE. Pt presenting with impairments listed below, will follow acutely. Continue to recommend OP OT at d/c.       If plan is discharge home, recommend the following:  A little help with walking and/or transfers;A little help with bathing/dressing/bathroom;Assist for transportation;Help with stairs or ramp for entrance   Equipment Recommendations  None recommended by OT    Recommendations for Other Services PT consult    Precautions / Restrictions Precautions Precautions: Fall Recall of Precautions/Restrictions: Intact Precaution/Restrictions Comments: SBP<160 Restrictions Weight Bearing Restrictions Per Provider Order: No       Mobility Bed Mobility Overal bed mobility: Independent                  Transfers                         Balance Overall balance assessment: Needs assistance Sitting-balance support: No upper extremity supported, Feet supported Sitting balance-Leahy Scale: Good                                     ADL either performed or assessed with clinical judgement   ADL Overall ADL's : Needs assistance/impaired Eating/Feeding: Set up;Sitting                                          Extremity/Trunk Assessment Upper Extremity Assessment Upper Extremity Assessment: Right hand dominant RUE Deficits / Details: ROM WFL, grossly  4+/5, incr numbness on R compared to LUE in digits RUE Sensation: decreased light touch RUE Coordination: decreased fine motor LUE Deficits / Details: ROM WFL, grossly 4+/5, decr sensation to digits LUE Coordination: decreased fine motor   Lower Extremity Assessment Lower Extremity Assessment: Defer to PT evaluation        Vision   Additional Comments: nystagmus noted with upward L gaze   Perception Perception Perception: Not tested   Praxis Praxis Praxis: Not tested   Communication Communication Communication: No apparent difficulties   Cognition Arousal: Alert Behavior During Therapy: WFL for tasks assessed/performed Cognition: No apparent impairments                               Following commands: Intact        Cueing   Cueing Techniques: Verbal cues  Exercises Exercises: Other exercises Other Exercises Other Exercises: theraputty exercise handout x5 Other Exercises: fine motor coordianton handout x5 Other Exercises: writing name on paper    Shoulder Instructions       General Comments VSS    Pertinent Vitals/ Pain       Pain Assessment Pain Assessment: No/denies pain  Home Living  Prior Functioning/Environment              Frequency  Min 2X/week        Progress Toward Goals  OT Goals(current goals can now be found in the care plan section)  Progress towards OT goals: Progressing toward goals  Acute Rehab OT Goals Patient Stated Goal: none stated OT Goal Formulation: With patient Time For Goal Achievement: 04/30/24 Potential to Achieve Goals: Good ADL Goals Pt Will Perform Tub/Shower Transfer: Tub transfer;Shower transfer;ambulating;Independently Additional ADL Goal #1: pt will perform fine motor RUE HEP in order to improve RUE strengthening for ADLs Additional ADL Goal #2: pt will perform standing ADL x10 min in order to improve dynamic standing  balance/activity tolerance for OOB activity  Plan      Co-evaluation                 AM-PAC OT 6 Clicks Daily Activity     Outcome Measure   Help from another person eating meals?: None Help from another person taking care of personal grooming?: None Help from another person toileting, which includes using toliet, bedpan, or urinal?: A Little Help from another person bathing (including washing, rinsing, drying)?: A Little Help from another person to put on and taking off regular upper body clothing?: A Little Help from another person to put on and taking off regular lower body clothing?: A Little 6 Click Score: 20    End of Session    OT Visit Diagnosis: Unsteadiness on feet (R26.81);Other abnormalities of gait and mobility (R26.89);Muscle weakness (generalized) (M62.81)   Activity Tolerance Patient tolerated treatment well   Patient Left in bed;with call bell/phone within reach   Nurse Communication Mobility status        Time: 8447-8383 OT Time Calculation (min): 24 min  Charges: OT General Charges $OT Visit: 1 Visit OT Treatments $Therapeutic Exercise: 23-37 mins  H[place] Luft K, OTD, OTR/L SecureChat Preferred Acute Rehab [phone]   Laneta POUR Koonce 04/18/2024, 4:27 PM

## 2024-04-18 NOTE — Plan of Care (Signed)

## 2024-04-18 NOTE — Progress Notes (Signed)
 Renal art exam is completed. Rebecca Andrews, RVT

## 2024-04-19 LAB — ANA W/REFLEX IF POSITIVE: Anti Nuclear Antibody (ANA): NEGATIVE

## 2024-04-22 LAB — METANEPHRINES, PLASMA
Metanephrine, Free: 39.7 pg/mL (ref 0.0–88.0)
Normetanephrine, Free: 119.4 pg/mL (ref 0.0–210.1)

## 2024-04-23 LAB — ALDOSTERONE + RENIN ACTIVITY W/ RATIO
ALDO / PRA Ratio: 18.2 (ref 0.0–30.0)
Aldosterone: 5.6 ng/dL (ref 0.0–30.0)
PRA LC/MS/MS: 0.307 ng/mL/h (ref 0.167–5.380)

## 2024-05-03 ENCOUNTER — Ambulatory Visit

## 2024-05-03 ENCOUNTER — Ambulatory Visit: Attending: Neurology

## 2024-05-03 DIAGNOSIS — R262 Difficulty in walking, not elsewhere classified: Secondary | ICD-10-CM | POA: Insufficient documentation

## 2024-05-03 DIAGNOSIS — M6281 Muscle weakness (generalized): Secondary | ICD-10-CM | POA: Insufficient documentation

## 2024-05-03 DIAGNOSIS — R2681 Unsteadiness on feet: Secondary | ICD-10-CM | POA: Diagnosis present

## 2024-05-03 DIAGNOSIS — R278 Other lack of coordination: Secondary | ICD-10-CM | POA: Diagnosis present

## 2024-05-03 NOTE — Therapy (Signed)
 OUTPATIENT PHYSICAL THERAPY EVALUATION  Patient Name: Rebecca Andrews MRN: [id number] DOB:01-22-1981, 43 y.o., female Today's Date: 05/03/2024  PCP: Patient, No Pcp Per REFERRING PROVIDER: Rosemarie Eather RAMAN, MD  END OF SESSION:  PT End of Session - 05/03/24 0944     Visit Number 1    Number of Visits 16    Date for PT Re-Evaluation 06/28/24    Authorization Type [employer] COMM PRO    Authorization Time Period 05/03/24-06/28/24    Progress Note Due on Visit 10    PT Start Time 0845    PT Stop Time 0930    PT Time Calculation (min) 45 min    Equipment Utilized During Treatment Gait belt    Activity Tolerance Patient tolerated treatment well;No increased pain    Behavior During Therapy [facility] for tasks assessed/performed         No past medical history on file. No past surgical history on file. 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   ESSENTIAL HYPERTENSION, BENIGN 09/02/2010   ONSET DATE: July 2025  REFERRING DIAG: Pontine Hemorrhage with Rt hemi weakness THERAPY DIAG:  Difficulty in walking, not elsewhere classified  Unsteadiness on feet  Rationale for Evaluation and Treatment: Rehabilitation  SUBJECTIVE:                                                                                                                                                                                          SUBJECTIVE STATEMENT: Pt has been home from hospital less than 2 weeks, has been walking in driveway, working on 2nd flor stairs performance, has done some hands free walking in house in short amounts. Pt reports no falls. Pt says bimanual tasks are improving, even brushing her teeth is getting notable easier. Pt says Rt hemibody paresthesia is improved ~ 50% since onset.   Pt accompanied by: boyfriend  PERTINENT HISTORY:   Rebecca Andrews ([id number]) Kauth is a 42yoF who comes to [facility] for evaluation s/p pontine hemorrhage in July 2025. Pt  experiencing Rt hemibody weakness and paresthesia, difficulty walking, maintaining balance. Pt DC to home with 3 DTR and boyfriend with RW, moving at supervision level to modified independent level.    PAIN:  Are you having pain? No   PRECAUTIONS: None  WEIGHT BEARING RESTRICTIONS: None   FALLS: Has patient fallen in last 6 months? No  LIVING ENVIRONMENT: Lives with: boyfriend, 3 DTRs (11yo, 13yo, 17yo)  Stairs: full flight to 2nd floor Has following equipment at home: RW on 1st level, RW on 2nd level   PLOF: independent   PATIENT GOALS: return to full independence  OBJECTIVE:  Note: Objective measures were completed at Evaluation unless otherwise noted.  SENSATION: Pt reports ~50% improvement in Rt hemibody paresthesia since onset; not formally assessed here.   COORDINATION: Appears WNL for gross motor tasks   MUSCLE TONE: Defer to later date prn; appears normal, but pt does mention limb stiffness after periods of rest.   FUNCTIONAL ASSESSMENT: -5xSTS: hands-free, no device no LOB  -AMB to failure: 136ft c RW or without  - : 15.96sec [place] RW  - retro: 25.85sec backwards no device, no LOB  -SLS: 30sec LLE, 12sec RLE                                                                                                                              TREATMENT DATE 05/03/24 :   -STS from chair x5 hands free -AMB 2x173ft with RW -AMB 2x95ft c RW  -AMB 2x176ft without device (standing recovery x60sec)  -AMB 1x163ft with 5kg ball carry  -AMB 1x167ft with 5kg ball carry + 5lb AW RLE -RLE SLS practice 10x to failure (all less tan 10sec)  -narrow stance eyes closed balance 1x60sec  -ad lib technique 8 stairs ascending, 8 stairs descening: 2 rails used and reciprocal stepping pattern, no LOB, no safety concerns -AMB overground over red soft cushy mat, 10 step up onto trampoline, then 10 step down to floor (minGuard assist)   PATIENT EDUCATION: Education details: how to  continue to safely advance her home activity going forward and new HEP items  Person educated: Patient and SO  Education method: discussion and handout Education comprehension: good   HOME EXERCISE PROGRAM: Access Code: [email or web address] URL: [email or web address] Date: 05/03/2024 Prepared by: Peggye Linear  Exercises - Single Leg Stance  - 3 x daily - 1 sets - 15 reps - Narrow Stance with Eyes Closed and Head Nods  - 3 x daily - 1 sets - 3 reps - 60 hold  GOALS: Goals reviewed with patient? no  SHORT TERM GOALS: Target date: 05/23/24  Pt to demonstrate Rt SLS balance >30sec  Baseline: 12sec Goal status: INITIAL  2.  Pt to demonstrate sustained AMB without break >1067ft with device ad lib  Baseline: 178ft  Goal status: INITIAL  3.  Pt to demonstrate improved confidence in gait AEB improvement in retro AMB by 50%.  Baseline: 25.85sec Goal status: INITIAL  4.  Pt to demonstrate 30sec chair rise test >15x  Baseline:  Goal status: INITIAL  LONG TERM GOALS: Target date: 06/28/24  Pt to demonstrate Rt SLS balance >60sec  Baseline: 12sec Goal status: INITIAL  2.  Pt to demonstrate >175ft   Baseline: 166ft tolerance  Goal status: INITIAL  3.  Pt to demonstrate improved confidence in gait AEB improvement in retro AMB to less than 12sec.  Baseline: 25.85sec Goal status: INITIAL  4.  Pt to report tolerance of AMB >1 miles for exercise walking.  Baseline:  Goal status: INITIAL  5. Pt to report  initiation, confidence, and compliance of LT fitness and rehab program 2 weeks prior to DC from PT services.     Baselines:    Goal Status: INITIAL  ASSESSMENT:  CLINICAL IMPRESSION: 52yoF who presents for PT evaluation less than 2 weeks following cerebral hemorrhage with Rt hemibody weakness. Examination revealing of significant limitations compared to baseline performance: pt tolerates only 128ft walking prior to fatigue warranting a rest break, requires RW  for most of her ADL/IADL performance. Patient will benefit from skilled physical therapy intervention to reduce deficits and impairments identified in evaluation, in order to reduce pain, improve quality of life, restore ability to serve as a caregiver to her 3 children and maximize activity tolerance for ADL, IADL, and leisure/fitness. Physical therapy will help pt achieve long and short term goals of care.   OBJECTIVE IMPAIRMENTS: Decreased knowledge of condition, decreased use of DME, decreased mobility, difficulty walking, decreased strength, decreased ROM. ACTIVITY LIMITATIONS: Lifting, standing, walking, squatting, transfers, locomotion level PARTICIPATION LIMITATIONS: Cleaning, laundry, interpersonal relationships, driving, yardwork, community activity.  PERSONAL FACTORS: Age, behavior pattern, education, past/current experiences, transportation, profession  are also affecting patient's functional outcome.  REHAB POTENTIAL: Good CLINICAL DECISION MAKING: Medium  EVALUATION COMPLEXITY: Moderate    PLAN:  PT FREQUENCY: 1-2x/week  PT DURATION: 8 weeks  PLANNED INTERVENTIONS: 97110-Therapeutic exercises, 97530- Therapeutic activity, 97112- Neuromuscular re-education, [phone]- Self Care, 02859- Manual therapy, [phone]- Gait training, [phone]- Electrical stimulation (unattended), [phone]- Electrical stimulation (manual), Patient/Family education, Balance training, Stair training, Joint mobilization, Joint manipulation, Cryotherapy, and Moist heat  PLAN FOR NEXT SESSION: Review and update HEP; get working on them LT goals of care  10:21 AM, 05/03/24 Peggye JAYSON Linear, PT, DPT Physical Therapist - [facility]  Outpatient Physical Therapy- Main Campus [phone]     Hansell C, PT 05/03/2024, 9:46 AM

## 2024-05-04 ENCOUNTER — Telehealth: Payer: Self-pay | Admitting: *Deleted

## 2024-05-04 ENCOUNTER — Telehealth: Payer: Self-pay

## 2024-05-04 DIAGNOSIS — I613 Nontraumatic intracerebral hemorrhage in brain stem: Secondary | ICD-10-CM

## 2024-05-04 NOTE — Patient Outreach (Signed)
 Stroke Discharge Follow-up   05/04/2024 Name:  Rebecca Andrews MRN:  [id number] DOB:  [birth year]  Subjective: Rebecca Andrews is a 43 y.o. year old female who is a primary care patient of Patient, No Pcp Per An Emmi alert was received indicating patient responded to questions: Went to follow-up appointment?. I reached out by phone to follow up on the alert and spoke to Patient.  Care Management Interventions: Verified with pt, she is scheduled to see primary care provider with [facility] on 05/05/24. SDOH screenings compelted Medications reviewed and importance of taking as prescribed  Follow up plan: No further intervention required.   Mliss Creed [facility], BSN RN Care Manager/ Transition of Care [place] [phone]

## 2024-05-05 ENCOUNTER — Encounter: Payer: Self-pay | Admitting: General Practice

## 2024-05-05 ENCOUNTER — Ambulatory Visit: Admitting: General Practice

## 2024-05-05 VITALS — BP 124/68 | HR 66 | Temp 98.2°F | Ht 62.0 in | Wt 197.0 lb

## 2024-05-05 DIAGNOSIS — K219 Gastro-esophageal reflux disease without esophagitis: Secondary | ICD-10-CM | POA: Diagnosis not present

## 2024-05-05 DIAGNOSIS — Z09 Encounter for follow-up examination after completed treatment for conditions other than malignant neoplasm: Secondary | ICD-10-CM | POA: Insufficient documentation

## 2024-05-05 DIAGNOSIS — Z833 Family history of diabetes mellitus: Secondary | ICD-10-CM | POA: Insufficient documentation

## 2024-05-05 DIAGNOSIS — I613 Nontraumatic intracerebral hemorrhage in brain stem: Secondary | ICD-10-CM | POA: Diagnosis not present

## 2024-05-05 DIAGNOSIS — I10 Essential (primary) hypertension: Secondary | ICD-10-CM

## 2024-05-05 DIAGNOSIS — Z7689 Persons encountering health services in other specified circumstances: Secondary | ICD-10-CM

## 2024-05-05 LAB — BASIC METABOLIC PANEL WITH GFR
BUN: 9 mg/dL (ref 6–23)
CO2: 29 meq/L (ref 19–32)
Calcium: 9.1 mg/dL (ref 8.4–10.5)
Chloride: 100 meq/L (ref 96–112)
Creatinine, Ser: 0.96 mg/dL (ref 0.40–1.20)
GFR: 73.83 mL/min
Glucose, Bld: 86 mg/dL (ref 70–99)
Potassium: 3.9 meq/L (ref 3.5–5.1)
Sodium: 136 meq/L (ref 135–145)

## 2024-05-05 LAB — CBC
HCT: 31.9 % — ABNORMAL LOW (ref 36.0–46.0)
Hemoglobin: 10.2 g/dL — ABNORMAL LOW (ref 12.0–15.0)
MCHC: 32 g/dL (ref 30.0–36.0)
MCV: 80.6 fl (ref 78.0–100.0)
Platelets: 339 K/uL (ref 150.0–400.0)
RBC: 3.96 Mil/uL (ref 3.87–5.11)
RDW: 18.1 % — ABNORMAL HIGH (ref 11.5–15.5)
WBC: 4.4 K/uL (ref 4.0–10.5)

## 2024-05-05 NOTE — Progress Notes (Addendum)
 New Patient Office Visit  Subjective    Patient ID: Rebecca Andrews, female    DOB: Sep 16, 1981  Age: 43 y.o. MRN: [id number]  CC:  Chief Complaint  Patient presents with   New Patient (Initial Visit)    Patient is due for pap and will have done here     HPI Rebecca Andrews is a 43 y.o. female presents to establish care.   Previous PCP/physical/labs: no recent pcp/physical/labs.  Her sister is also present today.  Discussed the use of AI scribe software for clinical note transcription with the patient, who gave verbal consent to proceed. History of Present Illness She experienced a stroke on April 14, 2024, characterized by an instant headache, vomiting, diarrhea, slurred speech, and unstable balance. She was told in the hospital that she had a stroke, which was referred to as a pontine hemorrhage stroke. She had an MRI on 04/15/24 which showed pontine hemorrhage; surrounding edema extending into the inferior midbrain in to the left middle cerebellar peduncle. 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 She had a CT Head which showed acute pontine hemorrhage measuring up to 2 cm. She was seen by neurology who requested her to follow up in 2 months at the stroke center.   Today she reports, She reports no deficits other than some issues with walking since her stroke. She is undergoing physical therapy twice a week and occupational therapy, although she missed her last occupational therapy session. She was initially placed in speech therapy but was told she no longer needed it as her speech improved.  She has a history of high blood pressure, which was notably elevated during her stroke event. Previously diagnosed with pregnancy-induced hypertension during her pregnancies, it resolved after childbirth but persisted after her third child. She is currently on amlodipine  10 mg once daily, labetalol  300 mg three times daily, and lisinopril  20 mg once daily for blood pressure  management. No headaches, blurred vision, or one-sided body weakness, and she has not missed any doses of her medication.  She is also taking Protonix , which was started in the hospital, possibly for acid reflux or related to her recent stroke, although the specific reason was not documented. She has a history of anemia, which she describes as 'always being cold', and her hemoglobin has been consistently low. Her sodium levels were low during her hospital stay, and she is due for lab work to monitor her electrolytes and hemoglobin.  Her family history is significant for diabetes, as her mother has the condition. 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 She is requesting to complete FMLA paperwork for her time off work since her stroke.   Outpatient Encounter Medications as of 05/05/2024  Medication Sig   acetaminophen  (TYLENOL ) 500 MG tablet Take 1,000 mg by mouth every 6 (six) hours as needed for mild pain (pain score 1-3) or headache.   amLODipine  (NORVASC ) 10 MG tablet Take 1 tablet (10 mg total) by mouth daily.   labetalol  (NORMODYNE ) 300 MG tablet Take 1 tablet (300 mg total) by mouth 3 (three) times daily.   lisinopril  (ZESTRIL ) 20 MG tablet Take 1 tablet (20 mg total) by mouth daily.   pantoprazole  (PROTONIX ) 40 MG tablet Take 1 tablet (40 mg total) by mouth at bedtime.   [DISCONTINUED] senna-docusate (SENOKOT-S) 8.6-50 MG tablet Take 1 tablet by mouth 2 (two) times daily. (Patient not taking: Reported on 05/04/2024)   No facility-administered encounter medications  on file as of 05/05/2024.    Past Medical History:  Diagnosis Date   Blood transfusion without reported diagnosis    Frequent headaches    Hypertension    Stroke [facility])     History reviewed. No pertinent surgical history.  Family History  Problem Relation Age of Onset   Diabetes Mother    Heart disease Father    Hypertension Father    Diabetes Sister    Hypertension  Maternal Grandmother    Hyperlipidemia Maternal Grandmother    Arthritis Maternal Grandmother    Arthritis Paternal Grandmother     Social History   Socioeconomic History   Marital status: Single    Spouse name: Not on file   Number of children: Not on file   Years of education: Not on file   Highest education level: High school graduate  Occupational History   Not on file  Tobacco Use   Smoking status: Former    Current packs/day: 0.00    Types: Cigarettes    Quit date: 2016    Years since quitting: 9.6   Smokeless tobacco: Never  Vaping Use   Vaping status: Unknown  Substance and Sexual Activity   Alcohol use: Yes   Drug use: No   Sexual activity: Yes  Other Topics Concern   Not on file  Social History Narrative   Not on file   Social Drivers of Health   Financial Resource Strain: Not on file  Food Insecurity: No Food Insecurity (05/04/2024)   Hunger Vital Sign    Worried About Running Out of Food in the Last Year: Never true    Ran Out of Food in the Last Year: Never true  Transportation Needs: No Transportation Needs (05/04/2024)   PRAPARE - Administrator, Civil Service (Medical): No    Lack of Transportation (Non-Medical): No  Physical Activity: Not on file  Stress: Not on file  Social Connections: Not on file  Intimate Partner Violence: Not At Risk (05/04/2024)   Humiliation, Afraid, Rape, and Kick questionnaire    Fear of Current or Ex-Partner: No    Emotionally Abused: No    Physically Abused: No    Sexually Abused: No    Review of Systems  Constitutional:  Negative for chills and fever.  Respiratory:  Negative for shortness of breath.   Cardiovascular:  Negative for chest pain.  Gastrointestinal:  Negative for abdominal pain, constipation, diarrhea, heartburn, nausea and vomiting.  Genitourinary:  Negative for dysuria, frequency and urgency.  Neurological:  Negative for dizziness and headaches.  Endo/Heme/Allergies:  Negative for  polydipsia.  Psychiatric/Behavioral:  Negative for depression and suicidal ideas. The patient is not nervous/anxious.         Objective    BP 124/68   Pulse 66   Temp 98.2 F (36.8 C) (Oral)   Ht 5' 2 (1.575 m)   Wt 197 lb (89.4 kg)   LMP 04/16/2024 (Exact Date)   SpO2 99%   BMI 36.03 kg/m   Physical Exam Vitals and nursing note reviewed.  Constitutional:      Appearance: Normal appearance.  Cardiovascular:     Rate and Rhythm: Normal rate and regular rhythm.     Pulses: Normal pulses.     Heart sounds: Normal heart sounds.  Pulmonary:     Effort: Pulmonary effort is normal.     Breath sounds: Normal breath sounds.  Neurological:     Mental Status: She is alert and oriented to person, place,  and time.  Psychiatric:        Mood and Affect: Mood normal.        Behavior: Behavior normal.        Thought Content: Thought content normal.        Judgment: Judgment normal.         Assessment & Plan:  Assessment and Plan Assessment & Plan Establish care with new doctor, encounter for - EMR reviewed briefly.  Hospital f/u - Reviewed hospital notes, labs and imaging.   Pontine hemorrhagic stroke Recent stroke with minor walking difficulties. Undergoing therapy.  - Continue physical therapy twice a week. - Continue occupational therapy. - Follow up with outpatient stroke clinic in two months. Phone number provided to patient. - Paperwork filled out for [facility] for handicap card for 6 months. - Consider annual neurologist follow-up.  Hypertension Persistent hypertension, initially pregnancy-induced, now controlled with medication. Blood pressure within target range. Discussed stroke prevention. - Continue current antihypertensive regimen: amlodipine  10 mg once daily, labetalol  300 mg three times daily, lisinopril  20 mg once daily. - Obtain a home blood pressure cuff (arm type) for regular monitoring. - Maintain blood pressure within target range.  Iron deficiency  anemia Chronic anemia with low hemoglobin history. Reports feeling cold. - Check hemoglobin levels.  Hyponatremia Low sodium levels, possibly chronic. Sodium levels need re-evaluation. - Check electrolyte levels.  Gastroesophageal reflux disease (GERD) On Protonix , possibly for reflux or stroke management. Continuing to prevent complications. - Continue Protonix  as prescribed.  Family hx of diabetes mellitus - with mother.  -Hemoglobin A1c 5.2 on 04/15/24   Return in about 2 months (around 07/05/2024) for physical.   Carrol Aurora, NP

## 2024-05-05 NOTE — Patient Instructions (Addendum)
 Please call and schedule appointment  Located in: [facility] Address: [address] [place], [place], [place] [address] Phone: [phone]  Continue blood pressure medications at home.   Start monitoring your blood pressure daily, around the same time of day, for the next 2-3 weeks.  Ensure that you have rested for 30 minutes prior to checking your blood pressure.   Notify me if you see numbers consistently at or above 130 on top and/or 90 on bottom. Or if it is below 100/60.  Follow up in 2 months for physical.   It was a pleasure to meet you today! Please don't hesitate to contact me with any questions. Welcome to [facility]!

## 2024-05-06 ENCOUNTER — Telehealth: Payer: Self-pay

## 2024-05-06 ENCOUNTER — Ambulatory Visit: Payer: Self-pay | Admitting: General Practice

## 2024-05-06 NOTE — Telephone Encounter (Signed)
 FMLA forms received.   Left message to return call to office. Need to follow up regarding FMLA/disability forms.

## 2024-05-09 NOTE — Therapy (Signed)
 OUTPATIENT PHYSICAL THERAPY TREATMENT  Patient Name: Rebecca Andrews MRN: [id number] DOB:10/01/1980, 43 y.o., female Today's Date: 05/10/2024  PCP: Vincente Shivers, NP REFERRING PROVIDER: Rosemarie Eather RAMAN, MD  END OF SESSION:  PT End of Session - 05/10/24 0821     Visit Number 2    Number of Visits 16    Date for PT Re-Evaluation 06/28/24    Authorization Type [employer] COMM PRO    Authorization Time Period 05/03/24-06/28/24    Progress Note Due on Visit 10    PT Start Time 0814    PT Stop Time 0845    PT Time Calculation (min) 31 min    Equipment Utilized During Treatment Gait belt    Activity Tolerance Patient tolerated treatment well;No increased pain    Behavior During Therapy WFL for tasks assessed/performed          Past Medical History:  Diagnosis Date   Blood transfusion without reported diagnosis    Frequent headaches    Hypertension    Stroke [facility])    No past surgical history on file. 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  Pt reports her R shoulder just feels stiff when trying to lift it.  Pt reports she has been doing her exercises and that's why she doesn't have her walker.  Pt accompanied by: daughter  PERTINENT HISTORY:   Rebecca  (sih-MOH-nuh) Andrews is a 42yoF who comes to [facility] for evaluation s/p pontine hemorrhage in July 2025. Pt experiencing Rt hemibody weakness and paresthesia, difficulty walking, maintaining balance. Pt DC to home with 3 DTR and boyfriend with RW, moving at supervision level to modified independent level.    PAIN:  Are you having pain? No   PRECAUTIONS: None  WEIGHT BEARING RESTRICTIONS: None   FALLS: Has patient fallen in last 6 months? No  LIVING ENVIRONMENT: Lives with: boyfriend, 3 DTRs (11yo, 13yo, 17yo)  Stairs: full flight to 2nd floor Has following equipment  at home: RW on 1st level, RW on 2nd level   PLOF: independent   PATIENT GOALS: return to full independence   OBJECTIVE:  Note: Objective measures were completed at Evaluation unless otherwise noted.  SENSATION: Pt reports ~50% improvement in Rt hemibody paresthesia since onset; not formally assessed here.   COORDINATION: Appears WNL for gross motor tasks   MUSCLE TONE: Defer to later date prn; appears normal, but pt does mention limb stiffness after periods of rest.   FUNCTIONAL ASSESSMENT: -5xSTS: hands-free, no device no LOB  -AMB to failure: 160ft c RW or without  - : 15.96sec [place] RW  - retro: 25.85sec backwards no device, no LOB  -SLS: 30sec LLE, 12sec RLE                                                                                                                              TREATMENT DATE 05/10/24:    TherAct: To improve functional movements patterns for everyday tasks  Ambulation around the gym to the cafeteria and down the EVS hallway, and back to the gym, x1 with slowed speed, but reports no fatigue  STS with staggered stance, x10 each LE leading   Neuromuscular Re-Ed: To facilitate reeducation of movement, balance, posture, coordination, and/or proprioception/kinesthetic sense.  Static stance on Airex pad, 30 sec bouts Static stance on Airex pad, eyes closed 30 sec bouts Static NBOS on  Airex pad, 30 sec bouts Static NBOS on Airex pad, eyes closed, 30 sec bouts Static staggered stance on Airex pad, 30 sec bouts Static staggered stance on Airex pad, eyes closed, 30 sec bouts  SLS >15 sec each LE, x2 attempts each  Backwards ambulation in gym, length of gym x2 with increased speed on the second attempt  PATIENT EDUCATION: Education details: how to continue to safely advance her home activity going forward and new HEP items  Person educated: Patient and SO  Education method: discussion and handout Education comprehension: good   HOME EXERCISE PROGRAM: Access Code: [email or web address] URL: [email or web address] Date: 05/03/2024 Prepared by: Peggye Linear  Exercises - Single Leg Stance  - 3 x daily - 1 sets - 15 reps - Narrow Stance with Eyes Closed and Head Nods  - 3 x daily - 1 sets - 3 reps - 60 hold  GOALS: Goals reviewed with patient? no  SHORT TERM GOALS: Target date: 05/23/24  Pt to demonstrate Rt SLS balance >30sec  Baseline: 12sec Goal status: INITIAL  2.  Pt to demonstrate sustained AMB without break >1024ft with device ad lib  Baseline: 135ft  Goal status: INITIAL  3.  Pt to demonstrate improved confidence in gait AEB improvement in retro AMB by 50%.  Baseline: 25.85sec Goal status: INITIAL  4.  Pt to demonstrate 30sec chair rise test >15x  Baseline:  Goal status: INITIAL  LONG TERM GOALS: Target date: 06/28/24  Pt to demonstrate Rt SLS balance >60sec  Baseline: 12sec Goal status: INITIAL  2.  Pt to demonstrate >1745ft   Baseline: 124ft tolerance  Goal status: INITIAL  3.  Pt to demonstrate improved confidence in gait AEB improvement in retro AMB to less than 12sec.  Baseline: 25.85sec Goal status: INITIAL  4.  Pt to report tolerance of AMB >1 miles for exercise walking.  Baseline:  Goal status: INITIAL  5. Pt to report initiation, confidence, and compliance of LT fitness and rehab program 2 weeks prior to DC  from PT services.     Baselines:    Goal Status: INITIAL  ASSESSMENT:  CLINICAL IMPRESSION:  Pt responded well to limited session and was able to demonstrate increased balance and stability with activities.  Pt is still slowed with gait, but when encouraged to ambulate faster, pt is able to perform without any complications.  Pt ultimately encouraged to continue to perform the exercises at home as they are beneficial and are working to improve her stability.  Pt voiced understanding.   Pt will continue to benefit from skilled therapy to address remaining deficits in order to improve overall QoL and return to PLOF.     OBJECTIVE IMPAIRMENTS: Decreased knowledge of condition, decreased use of DME, decreased mobility, difficulty walking, decreased strength, decreased ROM. ACTIVITY LIMITATIONS: Lifting, standing, walking, squatting, transfers, locomotion level PARTICIPATION LIMITATIONS: Cleaning, laundry, interpersonal relationships, driving, yardwork, community activity.  PERSONAL FACTORS: Age, behavior pattern, education, past/current experiences, transportation, profession  are also affecting patient's functional outcome.  REHAB POTENTIAL: Good CLINICAL DECISION MAKING: Medium  EVALUATION COMPLEXITY: Moderate    PLAN:  PT FREQUENCY: 1-2x/week  PT DURATION: 8 weeks  PLANNED INTERVENTIONS: 97110-Therapeutic exercises, 97530- Therapeutic activity, 97112- Neuromuscular re-education, [phone]- Self Care, 02859- Manual therapy, [phone]- Gait training, [phone]- Electrical stimulation (unattended), [phone]- Electrical stimulation (manual), Patient/Family education, Balance training, Stair training, Joint mobilization, Joint manipulation, Cryotherapy, and Moist heat  PLAN FOR NEXT SESSION: Review and update HEP; get working on them LT goals of care     Fonda Simpers, PT, DPT Physical Therapist - [facility]  [facility]  05/10/24, 8:49 AM

## 2024-05-10 ENCOUNTER — Ambulatory Visit

## 2024-05-10 DIAGNOSIS — R262 Difficulty in walking, not elsewhere classified: Secondary | ICD-10-CM

## 2024-05-10 DIAGNOSIS — M6281 Muscle weakness (generalized): Secondary | ICD-10-CM | POA: Diagnosis not present

## 2024-05-10 DIAGNOSIS — R2681 Unsteadiness on feet: Secondary | ICD-10-CM

## 2024-05-10 DIAGNOSIS — R278 Other lack of coordination: Secondary | ICD-10-CM

## 2024-05-10 NOTE — Telephone Encounter (Signed)
 FMLA for Patient forms received for completion for patient. Patient has been informed that process may take up to 5 business days.  Employer Name [employer] Reason for being out: Stroke Any Planned appointment? TBD Patient is requesting start date of 7.26.25 Patient is requesting end date of 9.26.25  Continuous leave for PT  Verified with patient that it is ok to leave Voicemail updates on  Mobile [phone] (mobile)  Patient would like to have mailed to home address on file when form is completed.   Fax number form should be sent to is [phone]  Forms placed in providers box for review.

## 2024-05-10 NOTE — Therapy (Unsigned)
 OUTPATIENT OCCUPATIONAL THERAPY NEURO EVALUATION  Patient Name: Rebecca Andrews MRN: [id number] DOB:11-30-1980, 43 y.o., female Today's Date: 05/10/2024  PCP: Vincente Shivers, NP REFERRING PROVIDER: Rosemarie Eather RAMAN, MD  END OF SESSION:  OT End of Session - 05/10/24 0831     Visit Number 1    Number of Visits 24    Date for OT Re-Evaluation 08/02/24    OT Start Time 0845    OT Stop Time 0930    OT Time Calculation (min) 45 min    Activity Tolerance Patient tolerated treatment well    Behavior During Therapy [facility] for tasks assessed/performed         Past Medical History:  Diagnosis Date   Blood transfusion without reported diagnosis    Frequent headaches    Hypertension    Stroke (HCC)    No past surgical history on file. 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  Rationale for Evaluation and Treatment: Rehabilitation  SUBJECTIVE: [place]   SUBJECTIVE STATEMENT: Pt reports initial tingling in BUEs and BLEs, but the L side is now gone, and the R side is now improving, reporting only tingling in R hand and R foot  Pt accompanied by: daughter,Shynee  PERTINENT HISTORY:  Hospital Course:  Patient is a 43 year old African female with past medical history significant for hypertension for approximately 17 years.  Apparently, patient had not followed up closely.  Patient developed headache, with associated nausea and vomiting, and was found to have systolic blood pressure of 230 mmHg.  Patient was worried that she was stroke symptoms, but could not describe further.  CT head done on presentation revealed pontine hemorrhage.  Patient was worked up extensively by the neurology  team.  [facility] hospitalist team assumed care today, 04/17/2024, to assist in arranging Primary care provider on discharge!!  Neurology team has cleared patient for discharge.  Patient will need outpatient follow-up arranged by Transition of care of team.  Blood pressure has remained reasonably controlled.  Hypokalemia was noted on presentation.  There will be need to rule out primary hyperaldosteronism (Aldosterone/Renin Ratio).  Doppler ultrasound of the renal arteries was non revealing.  PCP may consider checking plasma metanephrines.  No family history of brain bleeds endorsed (PCP can follow this up as well).     PRECAUTIONS: None  WEIGHT BEARING RESTRICTIONS: {Yes ***/No:24003}  PAIN:  Are you having pain? Yes: NPRS scale: 4/10 Pain location: R shoulder Pain description: Sore, tight muscles Aggravating factors: raising the arm Relieving factors: hot shower  FALLS: Has patient fallen in last 6 months? No  LIVING ENVIRONMENT: Lives with: Significant other and 3 daughters, ranging from 54, 27, 2 Lives in: {Lives in:25570} Stairs: {opstairs:27293} Has following equipment at home: Walker - 2 wheeled and bed side commode (does not have to use either of these)  PLOF: Independent, Curator at [employer]   PATIENT GOALS: A good recovery  OBJECTIVE:  Note: Objective measures were completed at Evaluation unless otherwise noted.  HAND DOMINANCE: Right  ADLs: Overall ADLs: *** Transfers/ambulation related to ADLs: indep Eating: using R dominant arm, able to cut food Grooming: messy ponytail, now able to brush teeth with  UB Dressing: indep  LB Dressing: indep Toileting: minor  shoulder stretch with peri care  Bathing: indep Tub Shower transfers: walk in shower small threshold Equipment: none  IADLs: No driving yet,  Shopping: supv  Light housekeeping: *** Meal Prep: *** Community mobility: *** Medication management: alarms on phone for BP, modified indep Financial management:  *** Handwriting: 100% legible  MOBILITY STATUS: {OTMOBILITY:25360}  POSTURE COMMENTS:  {posture:25561} Sitting balance: {sitting balance:25483}  ACTIVITY TOLERANCE: Activity tolerance: ***  FUNCTIONAL OUTCOME MEASURES: {OTFUNCTIONALMEASURES:27238}  UPPER EXTREMITY ROM:  LUE WNL  Active ROM Right eval Left eval  Shoulder flexion 155 (180)   Shoulder abduction 148 (180)   Shoulder adduction    Shoulder extension 80   Shoulder internal rotation 70   Shoulder external rotation    Elbow flexion    Elbow extension    Wrist flexion    Wrist extension    Wrist ulnar deviation    Wrist radial deviation    Wrist pronation    Wrist supination    (Blank rows = not tested)  UPPER EXTREMITY MMT:   LUE 5/5  MMT Right eval Left eval  Shoulder flexion 4   Shoulder abduction 4   Shoulder adduction    Shoulder extension    Shoulder internal rotation 4   Shoulder external rotation 4   Middle trapezius    Lower trapezius    Elbow flexion 5   Elbow extension 5   Wrist flexion 5   Wrist extension 5   Wrist ulnar deviation    Wrist radial deviation    Wrist pronation    Wrist supination    (Blank rows = not tested)  HAND FUNCTION: Grip strength: Right: 65 lbs; Left: 75 lbs, Lateral pinch: Right: 14 lbs, Left: 18 lbs, and 3 point pinch: Right: 11 lbs, Left: 16 lbs  COORDINATION: 9 Hole Peg test: Right: 31 sec; Left: 32 sec  SENSATION: {sensation:27233}  EDEMA: ***  MUSCLE TONE: {UETONE:25567}  COGNITION: Overall cognitive status: {cognition:24006}  VISION: Subjective report: *** Baseline vision: {OTBASELINEVISION:25363} Visual history: {OTVISUALHISTORY:25364}  VISION ASSESSMENT: {visionassessment:27231}  Patient has difficulty with following activities due to following visual impairments: ***  PERCEPTION: {Perception:25564}  PRAXIS: {Praxis:25565}  OBSERVATIONS: ***                                                                                                                              TREATMENT DATE: ***         PATIENT EDUCATION: Education details: *** Person educated: {Person educated:25204} Education method: {Education Method:25205} Education comprehension: {Education Comprehension:25206}  HOME EXERCISE PROGRAM: ***   GOALS: Goals reviewed with patient? {yes/no:20286}  SHORT TERM GOALS: Target date: ***  *** Baseline: Goal status: INITIAL  2.  *** Baseline:  Goal status: INITIAL  3.  *** Baseline:  Goal status: INITIAL  4.  *** Baseline:  Goal status: INITIAL  5.  *** Baseline:  Goal status: INITIAL  6.  *** Baseline:  Goal status: INITIAL  LONG TERM GOALS: Target date: ***  ***  Baseline:  Goal status: INITIAL  2.  *** Baseline:  Goal status: INITIAL  3.  *** Baseline:  Goal status: INITIAL  4.  *** Baseline:  Goal status: INITIAL  5.  *** Baseline:  Goal status: INITIAL  6.  *** Baseline:  Goal status: INITIAL  ASSESSMENT:  CLINICAL IMPRESSION: Patient is a *** y.o. *** who was seen today for occupational therapy evaluation for ***.   PERFORMANCE DEFICITS: in functional skills including {OT physical skills:25468}, cognitive skills including {OT cognitive skills:25469}, and psychosocial skills including {OT psychosocial skills:25470}.   IMPAIRMENTS: are limiting patient from {OT performance deficits:25471}.   CO-MORBIDITIES: {Comorbidities:25485} that affects occupational performance. Patient will benefit from skilled OT to address above impairments and improve overall function.  MODIFICATION OR ASSISTANCE TO COMPLETE EVALUATION: {OT modification:25474}  OT OCCUPATIONAL PROFILE AND HISTORY: {OT PROFILE AND HISTORY:25484}  CLINICAL DECISION MAKING: {OT CDM:25475}  REHAB POTENTIAL: {rehabpotential:25112}  EVALUATION COMPLEXITY: {Evaluation complexity:25115}    PLAN:  OT FREQUENCY: {rehab frequency:25116}  OT DURATION: {rehab duration:25117}  PLANNED  INTERVENTIONS: {OT Interventions:25467}  RECOMMENDED OTHER SERVICES: ***  CONSULTED AND AGREED WITH PLAN OF CARE: {ENR:74513}  PLAN FOR NEXT SESSION: ***   Inocente MARLA Blazing, OT 05/10/2024, 8:32 AM

## 2024-05-12 ENCOUNTER — Ambulatory Visit

## 2024-05-12 ENCOUNTER — Telehealth: Payer: Self-pay

## 2024-05-12 NOTE — Telephone Encounter (Signed)
 Informed pt of her missed OT/PT appts and notified her of her upcoming OT/PT appts next week. Provided clinic number and reminded pt to call if unable to make a scheduled appt.   Inocente Blazing, MS, OTR/L

## 2024-05-12 NOTE — Therapy (Incomplete)
 OUTPATIENT PHYSICAL THERAPY TREATMENT  Patient Name: Rebecca Andrews MRN: [id number] DOB:September 08, 1981, 43 y.o., female Today's Date: 05/12/2024  PCP: Vincente Shivers, NP REFERRING PROVIDER: Rosemarie Eather RAMAN, MD  END OF SESSION:    Past Medical History:  Diagnosis Date   Blood transfusion without reported diagnosis    Frequent headaches    Hypertension    Stroke [facility])    No past surgical history on file. 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  Rationale for Evaluation and Treatment: Rehabilitation  SUBJECTIVE:                                                                                                                                                                                          SUBJECTIVE STATEMENT:  ***   Pt accompanied by: daughter  PERTINENT HISTORY:   Rebecca (sih-MOH-nuh) Andrews is a 42yoF who comes to [facility] for evaluation s/p pontine hemorrhage in July 2025. Pt experiencing Rt hemibody weakness and paresthesia, difficulty walking, maintaining balance. Pt DC to home with 3 DTR and boyfriend with RW, moving at supervision level to modified independent level.    PAIN:  Are you having pain? No   PRECAUTIONS: None  WEIGHT BEARING RESTRICTIONS: None   FALLS: Has patient fallen in last 6 months? No  LIVING ENVIRONMENT: Lives with: boyfriend, 3 DTRs (11yo, 13yo, 17yo)  Stairs: full flight to 2nd floor Has following equipment at home: RW on 1st level, RW on 2nd level   PLOF: independent   PATIENT GOALS: return to full independence   OBJECTIVE:  Note: Objective measures were completed at Evaluation unless  otherwise noted.  SENSATION: Pt reports ~50% improvement in Rt hemibody paresthesia since onset; not formally assessed here.   COORDINATION: Appears WNL for gross motor tasks   MUSCLE TONE: Defer to later date prn; appears normal, but pt does mention limb stiffness after periods of rest.   FUNCTIONAL ASSESSMENT: -5xSTS: hands-free, no device no LOB  -AMB to failure: 170ft c RW or without  - : 15.96sec [place] RW  - retro: 25.85sec backwards no device, no LOB  -SLS: 30sec LLE, 12sec RLE  TREATMENT DATE 05/12/24:   *** TherAct: To improve functional movements patterns for everyday tasks  Ambulation around the gym to the cafeteria and down the EVS hallway, and back to the gym, x1 with slowed speed, but reports no fatigue  STS with staggered stance, x10 each LE leading   Neuromuscular Re-Ed: To facilitate reeducation of movement, balance, posture, coordination, and/or proprioception/kinesthetic sense.  Static stance on Airex pad, 30 sec bouts Static stance on Airex pad, eyes closed 30 sec bouts Static NBOS on Airex pad, 30 sec bouts Static NBOS on Airex pad, eyes closed, 30 sec bouts Static staggered stance on Airex pad, 30 sec bouts Static staggered stance on Airex pad, eyes closed, 30 sec bouts  SLS >15 sec each LE, x2 attempts each  Backwards ambulation in gym, length of gym x2 with increased speed on the second attempt  PATIENT EDUCATION: Education details: how to continue to safely advance her home activity going forward and new HEP items  Person educated: Patient and SO  Education method: discussion and handout Education comprehension: good   HOME EXERCISE PROGRAM: Access Code: [email or web address] URL: [email or web address] Date: 05/03/2024 Prepared by: Peggye Linear  Exercises - Single Leg Stance  - 3 x daily - 1 sets - 15 reps -  Narrow Stance with Eyes Closed and Head Nods  - 3 x daily - 1 sets - 3 reps - 60 hold  GOALS: Goals reviewed with patient? no  SHORT TERM GOALS: Target date: 05/23/24  Pt to demonstrate Rt SLS balance >30sec  Baseline: 12sec Goal status: INITIAL  2.  Pt to demonstrate sustained AMB without break >1040ft with device ad lib  Baseline: 136ft  Goal status: INITIAL  3.  Pt to demonstrate improved confidence in gait AEB improvement in retro AMB by 50%.  Baseline: 25.85sec Goal status: INITIAL  4.  Pt to demonstrate 30sec chair rise test >15x  Baseline:  Goal status: INITIAL  LONG TERM GOALS: Target date: 06/28/24  Pt to demonstrate Rt SLS balance >60sec  Baseline: 12sec Goal status: INITIAL  2.  Pt to demonstrate >1786ft   Baseline: 196ft tolerance  Goal status: INITIAL  3.  Pt to demonstrate improved confidence in gait AEB improvement in retro AMB to less than 12sec.  Baseline: 25.85sec Goal status: INITIAL  4.  Pt to report tolerance of AMB >1 miles for exercise walking.  Baseline:  Goal status: INITIAL  5. Pt to report initiation, confidence, and compliance of LT fitness and rehab program 2 weeks prior to DC from PT services.     Baselines:    Goal Status: INITIAL  ASSESSMENT:  CLINICAL IMPRESSION:  ***   OBJECTIVE IMPAIRMENTS: Decreased knowledge of condition, decreased use of DME, decreased mobility, difficulty walking, decreased strength, decreased ROM. ACTIVITY LIMITATIONS: Lifting, standing, walking, squatting, transfers, locomotion level PARTICIPATION LIMITATIONS: Cleaning, laundry, interpersonal relationships, driving, yardwork, community activity.  PERSONAL FACTORS: Age, behavior pattern, education, past/current experiences, transportation, profession  are also affecting patient's functional outcome.  REHAB POTENTIAL: Good CLINICAL DECISION MAKING: Medium  EVALUATION COMPLEXITY: Moderate    PLAN:  PT FREQUENCY: 1-2x/week  PT  DURATION: 8 weeks  PLANNED INTERVENTIONS: 97110-Therapeutic exercises, 97530- Therapeutic activity, [id number]- Neuromuscular re-education, 97535- Self Care, 02859- Manual therapy, [phone]- Gait training, [phone]- Electrical stimulation (unattended), [phone]- Electrical stimulation (manual), Patient/Family education, Balance training, Stair training, Joint mobilization, Joint manipulation, Cryotherapy, and Moist heat  PLAN FOR NEXT SESSION:   *** Review and update HEP; get working on them LT goals of care  Fonda Simpers, PT, DPT Physical Therapist - [facility]  05/12/24, 7:15 AM

## 2024-05-16 MED ORDER — LISINOPRIL 20 MG PO TABS: 20.0000 mg | ORAL_TABLET | Freq: Every day | ORAL | 0 refills | Status: DC

## 2024-05-16 MED ORDER — AMLODIPINE BESYLATE 10 MG PO TABS
10.0000 mg | ORAL_TABLET | Freq: Every day | ORAL | 0 refills | Status: DC
Start: 2024-05-16 — End: 2024-08-16

## 2024-05-16 MED ORDER — LABETALOL HCL 300 MG PO TABS: 300.0000 mg | ORAL_TABLET | Freq: Three times a day (TID) | ORAL | 0 refills | Status: DC

## 2024-05-17 ENCOUNTER — Ambulatory Visit

## 2024-05-17 DIAGNOSIS — M6281 Muscle weakness (generalized): Secondary | ICD-10-CM | POA: Diagnosis not present

## 2024-05-17 DIAGNOSIS — R278 Other lack of coordination: Secondary | ICD-10-CM

## 2024-05-17 DIAGNOSIS — R2681 Unsteadiness on feet: Secondary | ICD-10-CM

## 2024-05-17 DIAGNOSIS — R262 Difficulty in walking, not elsewhere classified: Secondary | ICD-10-CM

## 2024-05-17 NOTE — Therapy (Signed)
 OUTPATIENT PHYSICAL THERAPY TREATMENT  Patient Name: Rebecca Andrews MRN: [id number] DOB:July 19, 1981, 43 y.o., female Today's Date: 05/17/2024  PCP: Vincente Shivers, NP REFERRING PROVIDER: Rosemarie Eather RAMAN, MD  END OF SESSION:  PT End of Session - 05/17/24 0806     Visit Number 3    Number of Visits 16    Date for PT Re-Evaluation 06/28/24    Authorization Type [employer] COMM PRO    Authorization Time Period 05/03/24-06/28/24    Progress Note Due on Visit 10    PT Start Time 0804    PT Stop Time 0842    PT Time Calculation (min) 38 min    Equipment Utilized During Treatment Gait belt    Activity Tolerance Patient tolerated treatment well;No increased pain    Behavior During Therapy WFL for tasks assessed/performed           Past Medical History:  Diagnosis Date   Blood transfusion without reported diagnosis    Frequent headaches    Hypertension    Stroke [facility])    No past surgical history on file. 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   ESSENTIAL HYPERTENSION, BENIGN 09/02/2010   ONSET DATE: July 2025  REFERRING DIAG: Pontine Hemorrhage with Rt hemi weakness THERAPY DIAG:  Muscle weakness (generalized)  Other lack of coordination  Difficulty in walking, not elsewhere classified  Unsteadiness on feet  Rationale for Evaluation and Treatment: Rehabilitation  SUBJECTIVE:                                                                                                                                                                                          SUBJECTIVE STATEMENT: Pt doing well in generla has been challenging herself for wlaking as fast as family for open house. Pt also did a large volume of wlaking which was fatiguing thereafter.   PERTINENT HISTORY:   Rebecca ([id number])  Andrews is a 42yoF who comes to [facility] for evaluation s/p pontine hemorrhage in July 2025. Pt experiencing Rt hemibody weakness and paresthesia, difficulty walking, maintaining balance. Pt DC to home with 3 DTR and boyfriend with RW, moving at supervision level to modified independent level.    PAIN:  Are you having pain? Rt shoulder pain at end range elevation.    PRECAUTIONS: None  WEIGHT BEARING RESTRICTIONS: None   FALLS: Has patient fallen in last 6 months? No  LIVING ENVIRONMENT: Lives with: boyfriend, 3 DTRs (11yo, 13yo, 17yo)  Stairs: full flight to 2nd floor  Has following equipment at home: RW on 1st level, RW on 2nd level   PLOF: independent   PATIENT GOALS: return to full independence   OBJECTIVE:  Note: Objective measures were completed at Evaluation unless otherwise noted.                                                                                                                             TREATMENT DATE 05/17/24:   STS from chair + 5kg ball x10  Balance beam walk in // bars 8x36ft  STS from chair + 5kg ball x10 Balance beam walk in // bars 8x24ft  (ends elevated on airex foam)  22.5lb cable row 1x10 in Lt staggered stance, 1x10 in Rt staggered stance  17.5 cable resisted walking 2x each direction (x4)  AMB to [facility], carry 25lb bulgarian bag back to gym (twice)  HIIT 50lb chair sled push 58ft at fastests pace, 10sec breaks between: 18sec, 15sec, 13sec, 14sec.  Agility ladder with cable resistance, and step ups added   PATIENT EDUCATION: Education details: how to continue to safely advance her home activity going forward and new HEP items  Person educated: Patient and SO  Education method: discussion and handout Education comprehension: good   HOME EXERCISE PROGRAM: Access Code: [email or web address] URL: [email or web address] Date: 05/03/2024 Prepared by: Peggye Linear  Exercises - Single Leg Stance  - 3 x daily - 1 sets - 15 reps - Narrow Stance with  Eyes Closed and Head Nods  - 3 x daily - 1 sets - 3 reps - 60 hold  GOALS: Goals reviewed with patient? no  SHORT TERM GOALS: Target date: 05/23/24  Pt to demonstrate Rt SLS balance >30sec  Baseline: 12sec Goal status: INITIAL  2.  Pt to demonstrate sustained AMB without break >1052ft with device ad lib  Baseline: 162ft  Goal status: INITIAL  3.  Pt to demonstrate improved confidence in gait AEB improvement in retro AMB by 50%.  Baseline: 25.85sec Goal status: INITIAL  4.  Pt to demonstrate 30sec chair rise test >15x  Baseline:  Goal status: INITIAL  LONG TERM GOALS: Target date: 06/28/24  Pt to demonstrate Rt SLS balance >60sec  Baseline: 12sec Goal status: INITIAL  2.  Pt to demonstrate >1730ft   Baseline: 178ft tolerance  Goal status: INITIAL  3.  Pt to demonstrate improved confidence in gait AEB improvement in retro AMB to less than 12sec.  Baseline: 25.85sec Goal status: INITIAL  4.  Pt to report tolerance of AMB >1 miles for exercise walking.  Baseline:  Goal status: INITIAL  5. Pt to report initiation, confidence, and compliance of LT fitness and rehab program 2 weeks prior to DC from PT services.     Baselines:    Goal Status: INITIAL  ASSESSMENT:  CLINICAL IMPRESSION:  Continued to expand on program for motor control, strengthening, and activity tolerance. Patient will benefit from skilled physical therapy intervention to reduce deficits and impairments identified  in evaluation, in order to reduce pain, improve quality of life, and maximize activity tolerance for ADL, IADL, and leisure/fitness. Physical therapy will help pt achieve long and short term goals of care.   OBJECTIVE IMPAIRMENTS: Decreased knowledge of condition, decreased use of DME, decreased mobility, difficulty walking, decreased strength, decreased ROM. ACTIVITY LIMITATIONS: Lifting, standing, walking, squatting, transfers, locomotion level PARTICIPATION LIMITATIONS:  Cleaning, laundry, interpersonal relationships, driving, yardwork, community activity.  PERSONAL FACTORS: Age, behavior pattern, education, past/current experiences, transportation, profession  are also affecting patient's functional outcome.  REHAB POTENTIAL: Good CLINICAL DECISION MAKING: Medium  EVALUATION COMPLEXITY: Moderate    PLAN:  PT FREQUENCY: 1-2x/week  PT DURATION: 8 weeks  PLANNED INTERVENTIONS: 97110-Therapeutic exercises, 97530- Therapeutic activity, 97112- Neuromuscular re-education, [phone]- Self Care, 02859- Manual therapy, [phone]- Gait training, [phone]- Electrical stimulation (unattended), [phone]- Electrical stimulation (manual), Patient/Family education, Balance training, Stair training, Joint mobilization, Joint manipulation, Cryotherapy, and Moist heat  PLAN FOR NEXT SESSION:   Review and update HEP; get working on them LT goals of care   8:10 AM, 05/17/24 Peggye JAYSON Linear, PT, DPT Physical Therapist - [facility]  Outpatient Physical Therapy- Main Campus [phone]

## 2024-05-17 NOTE — Therapy (Unsigned)
 OUTPATIENT OCCUPATIONAL THERAPY NEURO TREATMENT NOTE  Patient Name: Rebecca Andrews MRN: [id number] DOB:1981/04/20, 43 y.o., female Today's Date: 05/18/2024  PCP: Vincente Shivers, NP REFERRING PROVIDER: Rosemarie Eather RAMAN, MD  END OF SESSION:  OT End of Session - 05/17/24 0845     Visit Number 2    Number of Visits 24    Date for OT Re-Evaluation 08/02/24    OT Start Time 0845    OT Stop Time 0930    OT Time Calculation (min) 45 min    Activity Tolerance Patient tolerated treatment well    Behavior During Therapy [facility] for tasks assessed/performed         Past Medical History:  Diagnosis Date   Blood transfusion without reported diagnosis    Frequent headaches    Hypertension    Stroke (HCC)    No past surgical history on file. 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  Pt accompanied by: daughter,Shynee  PERTINENT HISTORY: Per MEDICAL RECORD NUMBERHospital Course:  Patient is a 44 year old African female with past medical history significant for hypertension for approximately 17 years.  Apparently, patient had not followed up closely.  Patient developed headache, with associated nausea and vomiting, and was found to have systolic blood pressure of 230 mmHg.  Patient was worried that she was stroke symptoms, but could not describe further.  CT head done on presentation revealed pontine hemorrhage.  Patient was  worked up extensively by the neurology team.  [facility] hospitalist team assumed care today, 04/17/2024, to assist in arranging Primary care provider on discharge.  Neurology team has cleared patient for discharge.  Patient will need outpatient follow-up arranged by Transition of care of team.  Blood pressure has remained reasonably controlled.  Hypokalemia was noted on presentation.  There will be need to rule out primary hyperaldosteronism (Aldosterone/Renin Ratio).  Doppler ultrasound of the renal arteries was non revealing.  PCP may consider checking plasma metanephrines.  No family history of brain bleeds endorsed (PCP can follow this up as well).     PRECAUTIONS: None  WEIGHT BEARING RESTRICTIONS: No  PAIN: 05/17/24: 2/10 R shoulder (feels stiff) Are you having pain? Yes: NPRS scale: 4/10 Pain location: R shoulder Pain description: Sore, tight muscles Aggravating factors: raising the arm Relieving factors: hot shower  FALLS: Has patient fallen in last 6 months? No  LIVING ENVIRONMENT: Lives with: Significant other and 3 daughters, ranging from 78, 82, 42 Stairs: Full flight to 2nd floor Has following equipment at home: Walker - 2 wheeled and bed side commode (does not have to use either of these anymore, per pt)  PLOF: Independent, Curator at [employer]   PATIENT GOALS: A good recovery  OBJECTIVE:  Note: Objective measures were completed at Evaluation unless otherwise noted.  HAND DOMINANCE: Right  ADLs: Overall ADLs: pt reports good use of the R hand for most tasks Transfers/ambulation related to ADLs: indep without AD Eating: using R dominant arm, able to cut food Grooming: pt reports  her daughter is helping her to put her hair up; pt reports that she can manage only a messy ponytail independently.  Now able to brush teeth without difficulty with R hand UB Dressing: indep  LB Dressing: indep Toileting: minor shoulder stretch felt with peri care when using R dominant  hand Bathing: indep Tub Shower transfers: independent with small threshold into walk in shower Equipment: none  IADLs:             Shopping: supv d/t not yet driving Light housekeeping: indep Meal Prep: improving with ability to open containers/tight jars, increased effort Community mobility: indep without AD Medication management: daughter has set alarms on phone for BP meds, now modified indep Financial management: indep Handwriting: 100% legible with R dominant hand  POSTURE COMMENTS:  No Significant postural limitations  ACTIVITY TOLERANCE: Activity tolerance: Will continue to assess within functional contexts  UPPER EXTREMITY ROM:    Active ROM Right eval Left Eval WNL throughout   Shoulder flexion 155 (180)   Shoulder abduction 148 (180)   Shoulder adduction    Shoulder extension    Shoulder internal rotation 70   Shoulder external rotation 70   Elbow flexion    Elbow extension    Wrist flexion    Wrist extension    Wrist ulnar deviation    Wrist radial deviation    Wrist pronation    Wrist supination    (Blank rows = not tested)  UPPER EXTREMITY MMT:   LUE 5/5  MMT Right eval Left Eval  Shoulder flexion 4 (within available active range) 5  Shoulder abduction 4 (within available active range) 5  Shoulder adduction    Shoulder extension    Shoulder internal rotation 4 5  Shoulder external rotation 4 5  Middle trapezius    Lower trapezius    Elbow flexion 5 5  Elbow extension 5 5  Wrist flexion 5 5  Wrist extension 5 5  Wrist ulnar deviation    Wrist radial deviation    Wrist pronation    Wrist supination    (Blank rows = not tested)  HAND FUNCTION: Grip strength: Right: 65 lbs; Left: 75 lbs, Lateral pinch: Right: 14 lbs, Left: 18 lbs, and 3 point pinch: Right: 11 lbs, Left: 16 lbs  COORDINATION: Finger Nose Finger test: Good speed and accuracy in BUEs 9 Hole Peg test: Right: 31 sec; Left: 32 sec  SENSATION: Pt reports initial tingling in  BUEs and BLEs, but the L side tingling is now gone, and the R side is now improving, reporting only tingling in R hand and R foot   EDEMA: No visible edema  MUSCLE TONE: RUE: Within functional limits  COGNITION: Overall cognitive status: Within functional limits for tasks assessed  VISION ASSESSMENT: Will test further within functional contexts as needed, as pt reports no changes in vision since ICH  PERCEPTION: Not tested  PRAXIS: WFL  OBSERVATIONS:  Mild R shoulder stiffness and weakness noted.  R dominant hand slightly lagging behind the L non-dominant, but pt reports good use of the R arm for all daily tasks except for tasks that require extended reach over or behind head or behind back.  TREATMENT DATE: 05/17/24: Therapeutic Exercise: -Dowel stretches in sitting and supine for bilat shoulder flexion overhead, ER to top of head, and R/L shoulder abd x2 sets 10 reps each.  Min vc for form/technique. -Dowel stretches in standing for shoulder IR and ext behind back, min vc for erect standing posture/to reduce substitution patterns. -Issued red theraband and instructed/completed BUE strengthening for bilat shoulder horiz abd, R shoulder flex, abd, elbow flex, elbow ext, ER with elbows at sides; min vc for form/technique. -Issued green theraband for trial with 2nd set (performed above noted exercises), with min vc for band tension adjustment as needed.  Self Care: -Visual handout issued and reviewed for new theraband exercises.  Advised on HEP progression, modification, performing all exercises to tolerance, and education r/t benefits of supine vs sitting vs standing for above noted exercises to achieve desired R shoulder mobility goals.    PATIENT EDUCATION: Education details: HEP review/progression  Person educated: Patient Education method: Solicitor,  Verbal cues, and Handouts Education comprehension: verbalized understanding, returned demonstration, verbal cues required, and needs further education  HOME EXERCISE PROGRAM: Pink theraputty Dowel stretches for R shoulder flexibility  Red/green theraband for BUE strengthening  GOALS: Goals reviewed with patient? Yes  SHORT TERM GOALS: Target date: 06/21/24  Pt will be indep to perform HEP for improving RUE strength and flexibility. Baseline: Eval: HEP initiated (putty and dowel stretches); further training needed Goal status: INITIAL  LONG TERM GOALS: Target date: 08/02/24  Pt will increase R grip strength by 10 or more lbs in order to improve ability to open tight jars/containers.   Baseline: Eval: R 65 (L 75) Goal status: INITIAL  2.  Pt will increase R shoulder strength by 1/2 grade or more in order to improve tolerance for bimanual hair care.  Baseline: Eval: Daughter currently helps pt to put hair up in ponytail.  Pt reports she can only manage a messy ponytail (R shoulder weakness and stiffness; 4/5 grossly) Goal status: INITIAL  3.  Pt will increase R shoulder flexibility to enable reach overhead, behind head, and behind back for ADLs without reports of discomfort. Baseline: Eval: R shoulder flex 155, abd 148, IR/ER 70; pt reports discomfort with overhead reach, hair care, and pericare d/t R shoulder stiffness Goal status: INITIAL  ASSESSMENT:  CLINICAL IMPRESSION: Pt reports some residual R shoulder stiffness, but has improved from last week, and pt does acknowledge less stiffness after moving her arm.  Pt tolerated HEP progression well today, and acknowledged increased end range in supine for bilat shoulder flex/abd/ER as compared to performing these in sitting or standing.  Pt reports good adherence to use of theraputty for R hand strengthening, and dowel stretches, and has noted improvements in use of her R arm and hand to manage her hair since last seen by OT.  Last week  pt's kids were assisting pt to put hair in a pony tail, and pt reported she put her own hair up this morning, though it did require some increased effort compared to baseline.  Pt shows good understanding of HEP, requiring only min vc for form/technique with progressions today.  Anticipate readiness for d/c next session with the anticipation that pt wil be indep with HEP at that time.  Pt in agreement with plan.   PERFORMANCE DEFICITS: in functional skills including ADLs, IADLs, sensation, ROM, strength, pain, flexibility, and UE functional use, and psychosocial skills including coping strategies, environmental adaptation, and routines and behaviors.   IMPAIRMENTS: are limiting patient from ADLs,  IADLs, and work.   CO-MORBIDITIES: has co-morbidities such as HTN head aches, that affects occupational performance. Patient will benefit from skilled OT to address above impairments and improve overall function.  MODIFICATION OR ASSISTANCE TO COMPLETE EVALUATION: No modification of tasks or assist necessary to complete an evaluation.  OT OCCUPATIONAL PROFILE AND HISTORY: Detailed assessment: Review of records and additional review of physical, cognitive, psychosocial history related to current functional performance.  CLINICAL DECISION MAKING: Moderate - several treatment options, min-mod task modification necessary  REHAB POTENTIAL: Good  EVALUATION COMPLEXITY: Moderate    PLAN:  OT FREQUENCY: 1-2x/week  OT DURATION: 12 weeks  PLANNED INTERVENTIONS: 97168 OT Re-evaluation, 97535 self care/ADL training, 02889 therapeutic exercise, 97530 therapeutic activity, 97112 neuromuscular re-education, 97140 manual therapy, 97010 moist heat, 97010 cryotherapy, 97750 Physical Performance Testing, passive range of motion, psychosocial skills training, energy conservation, coping strategies training, patient/family education, and DME and/or AE instructions  RECOMMENDED OTHER SERVICES: None at this  time  CONSULTED AND AGREED WITH PLAN OF CARE: Patient  PLAN FOR NEXT SESSION: HEP review, assess readiness for d/c  Inocente Blazing, MS, OTR/L  Inocente MARLA Blazing, OT 05/18/2024, 2:57 PM

## 2024-05-19 ENCOUNTER — Ambulatory Visit

## 2024-05-24 ENCOUNTER — Ambulatory Visit

## 2024-05-24 ENCOUNTER — Ambulatory Visit: Attending: Neurology

## 2024-05-24 DIAGNOSIS — R278 Other lack of coordination: Secondary | ICD-10-CM | POA: Insufficient documentation

## 2024-05-24 DIAGNOSIS — R262 Difficulty in walking, not elsewhere classified: Secondary | ICD-10-CM | POA: Diagnosis present

## 2024-05-24 DIAGNOSIS — M6281 Muscle weakness (generalized): Secondary | ICD-10-CM | POA: Insufficient documentation

## 2024-05-24 DIAGNOSIS — R2681 Unsteadiness on feet: Secondary | ICD-10-CM | POA: Diagnosis present

## 2024-05-24 NOTE — Therapy (Signed)
 OUTPATIENT PHYSICAL THERAPY TREATMENT  Patient Name: Rebecca Andrews MRN: [id number] DOB:05/24/1981, 43 y.o., female Today's Date: 05/24/2024  PCP: Vincente Shivers, NP REFERRING PROVIDER: Rosemarie Eather RAMAN, MD  END OF SESSION:  PT End of Session - 05/24/24 0824     Visit Number 4    Number of Visits 16    Date for PT Re-Evaluation 06/28/24    Authorization Type [employer] COMM PRO    Authorization Time Period 05/03/24-06/28/24    Progress Note Due on Visit 10    PT Start Time 0821   late arrival   PT Stop Time 0845    PT Time Calculation (min) 24 min    Equipment Utilized During Treatment Gait belt    Activity Tolerance Patient tolerated treatment well;No increased pain    Behavior During Therapy WFL for tasks assessed/performed           Past Medical History:  Diagnosis Date   Blood transfusion without reported diagnosis    Frequent headaches    Hypertension    Stroke [facility])    No past surgical history on file. 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   ESSENTIAL HYPERTENSION, BENIGN 09/02/2010   ONSET DATE: July 2025  REFERRING DIAG: Pontine Hemorrhage with Rt hemi weakness THERAPY DIAG:  Muscle weakness (generalized)  Other lack of coordination  Difficulty in walking, not elsewhere classified  Unsteadiness on feet  Rationale for Evaluation and Treatment: Rehabilitation  SUBJECTIVE:                                                                                                                                                                                          SUBJECTIVE STATEMENT: Pt doing well in general; reports she had to do a lot of walking at the football game Friday night. Also went for a walk down the street which went well.   PERTINENT HISTORY:   Rebecca ([id number])  Andrews is a 42yoF who comes to [facility] for evaluation s/p pontine hemorrhage in July 2025. Pt experiencing Rt hemibody weakness and paresthesia, difficulty walking, maintaining balance. Pt DC to home with 3 DTR and boyfriend with RW, moving at supervision level to modified independent level.    PAIN:  Are you having pain? No    PRECAUTIONS: None  WEIGHT BEARING RESTRICTIONS: None   FALLS: Has patient fallen in last 6 months? No  LIVING ENVIRONMENT: Lives with: boyfriend, 3 DTRs (11yo, 13yo, 17yo)  Stairs: full flight to 2nd floor Has following  equipment at home: RW on 1st level, RW on 2nd level   PLOF: independent   PATIENT GOALS: return to full independence   OBJECTIVE:  Note: Objective measures were completed at Evaluation unless otherwise noted.                                                                                                                             TREATMENT DATE 05/24/24:   -STS from chair x15  -high knee marching in place x30 (5lb AW bilat)  -wide step lateral, alternating x30 (5lb AW bilat)  -STS from chair x15 c 5kg ball  -high knee marching in place x30 (5lb AW bilat)  -wide step lateral, alternating x30 (5lb AW bilat)  -c 5lb AW bilat, AW to closet, 15lb kettle bell lift and carry to hallway repeat x4; -HIIT 60lb chair sled push 67ft at fastests pace, 15-30 sec breaks between (load increased this date)   PATIENT EDUCATION: Education details: how to continue to safely advance her home activity going forward and new HEP items  Person educated: Patient and SO  Education method: discussion and handout Education comprehension: good   HOME EXERCISE PROGRAM: Access Code: [email or web address] URL: [email or web address] Date: 05/03/2024 Prepared by: Peggye Linear  Exercises - Single Leg Stance  - 3 x daily - 1 sets - 15 reps - Narrow Stance with Eyes Closed and Head Nods  - 3 x daily - 1 sets - 3 reps - 60 hold  GOALS: Goals reviewed with patient?  no  SHORT TERM GOALS: Target date: 05/23/24  Pt to demonstrate Rt SLS balance >30sec  Baseline: 12sec Goal status: INITIAL  2.  Pt to demonstrate sustained AMB without break >1057ft with device ad lib  Baseline: 11ft  Goal status: INITIAL  3.  Pt to demonstrate improved confidence in gait AEB improvement in retro AMB by 50%.  Baseline: 25.85sec Goal status: INITIAL  4.  Pt to demonstrate 30sec chair rise test >15x  Baseline:  Goal status: INITIAL  LONG TERM GOALS: Target date: 06/28/24  Pt to demonstrate Rt SLS balance >60sec  Baseline: 12sec Goal status: INITIAL  2.  Pt to demonstrate >1763ft   Baseline: 136ft tolerance  Goal status: INITIAL  3.  Pt to demonstrate improved confidence in gait AEB improvement in retro AMB to less than 12sec.  Baseline: 25.85sec Goal status: INITIAL  4.  Pt to report tolerance of AMB >1 miles for exercise walking.  Baseline:  Goal status: INITIAL  5. Pt to report initiation, confidence, and compliance of LT fitness and rehab program 2 weeks prior to DC from PT services.     Baselines:    Goal Status: INITIAL  ASSESSMENT:  CLINICAL IMPRESSION: Continued to expand on program for motor control, strengthening, and activity tolerance. Pt continues to report successful and gradual progress of IADL mobility in community as her kids schoolyear starts off. Patient will benefit from skilled physical therapy intervention to reduce deficits  and impairments identified in evaluation, in order to reduce pain, improve quality of life, and maximize activity tolerance for ADL, IADL, and leisure/fitness. Physical therapy will help pt achieve long and short term goals of care.   OBJECTIVE IMPAIRMENTS: Decreased knowledge of condition, decreased use of DME, decreased mobility, difficulty walking, decreased strength, decreased ROM. ACTIVITY LIMITATIONS: Lifting, standing, walking, squatting, transfers, locomotion level PARTICIPATION  LIMITATIONS: Cleaning, laundry, interpersonal relationships, driving, yardwork, community activity.  PERSONAL FACTORS: Age, behavior pattern, education, past/current experiences, transportation, profession  are also affecting patient's functional outcome.  REHAB POTENTIAL: Good CLINICAL DECISION MAKING: Medium  EVALUATION COMPLEXITY: Moderate    PLAN:  PT FREQUENCY: 1-2x/week  PT DURATION: 8 weeks  PLANNED INTERVENTIONS: 97110-Therapeutic exercises, 97530- Therapeutic activity, 97112- Neuromuscular re-education, [phone]- Self Care, 02859- Manual therapy, [phone]- Gait training, [phone]- Electrical stimulation (unattended), [phone]- Electrical stimulation (manual), Patient/Family education, Balance training, Stair training, Joint mobilization, Joint manipulation, Cryotherapy, and Moist heat  PLAN FOR NEXT SESSION:  Review and update HEP; get working on them LT goals of care   8:26 AM, 05/24/24 Peggye JAYSON Linear, PT, DPT Physical Therapist - [facility]  Outpatient Physical Therapy- Main Campus [phone]

## 2024-05-24 NOTE — Therapy (Signed)
 OUTPATIENT OCCUPATIONAL THERAPY NEURO DISCHARGE NOTE  Patient Name: Rebecca Andrews MRN: [id number] DOB:Jan 11, 1981, 43 y.o., female Today's Date: 05/24/2024  PCP: Vincente Shivers, NP REFERRING PROVIDER: Rosemarie Eather RAMAN, MD  END OF SESSION:  OT End of Session - 05/24/24 9171     Visit Number 3    Number of Visits 24    Date for OT Re-Evaluation 08/02/24    OT Start Time 0845    OT Stop Time 0930    OT Time Calculation (min) 45 min    Activity Tolerance Patient tolerated treatment well    Behavior During Therapy [facility] for tasks assessed/performed         Past Medical History:  Diagnosis Date   Blood transfusion without reported diagnosis    Frequent headaches    Hypertension    Stroke (HCC)    No past surgical history on file. 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 Pt accompanied by: daughter,Shynee  PERTINENT HISTORY: Per MEDICAL RECORD NUMBERHospital Course:  Patient is a 43 year old African female with past medical history significant for hypertension for approximately 17 years.  Apparently, patient had not followed up closely.  Patient developed headache, with associated nausea and vomiting, and was found to have systolic blood pressure of 230 mmHg.  Patient was worried that she was stroke symptoms, but could not describe further.  CT head done on presentation revealed pontine hemorrhage.  Patient was worked up extensively by the neurology team.  [facility]  hospitalist team assumed care today, 04/17/2024, to assist in arranging Primary care provider on discharge.  Neurology team has cleared patient for discharge.  Patient will need outpatient follow-up arranged by Transition of care of team.  Blood pressure has remained reasonably controlled.  Hypokalemia was noted on presentation.  There will be need to rule out primary hyperaldosteronism (Aldosterone/Renin Ratio).  Doppler ultrasound of the renal arteries was non revealing.  PCP may consider checking plasma metanephrines.  No family history of brain bleeds endorsed (PCP can follow this up as well).     PRECAUTIONS: None  WEIGHT BEARING RESTRICTIONS: No  PAIN: 05/24/24: 0 pain  Are you having pain? Yes: NPRS scale: 4/10 Pain location: R shoulder Pain description: Sore, tight muscles Aggravating factors: raising the arm Relieving factors: hot shower  FALLS: Has patient fallen in last 6 months? No  LIVING ENVIRONMENT: Lives with: Significant other and 3 daughters, ranging from 81, 33, 36 Stairs: Full flight to 2nd floor Has following equipment at home: Walker - 2 wheeled and bed side commode (does not have to use either of these anymore, per pt)  PLOF: Independent, Curator at [employer]   PATIENT GOALS: A good recovery  OBJECTIVE:  Note: Objective measures were completed at Evaluation unless otherwise noted.  HAND DOMINANCE: Right  ADLs: Overall ADLs: pt reports good use of the R hand for most tasks Transfers/ambulation related to ADLs: indep without AD Eating: using R dominant arm, able to cut food Grooming: pt reports her daughter is helping her to put her hair up; pt reports  that she can manage only a messy ponytail independently.  Now able to brush teeth without difficulty with R hand UB Dressing: indep  LB Dressing: indep Toileting: minor shoulder stretch felt with peri care when using R dominant hand Bathing: indep Tub Shower transfers: independent with small threshold into  walk in shower Equipment: none  IADLs:             Shopping: supv d/t not yet driving Light housekeeping: indep Meal Prep: improving with ability to open containers/tight jars, increased effort Community mobility: indep without AD Medication management: daughter has set alarms on phone for BP meds, now modified indep Financial management: indep Handwriting: 100% legible with R dominant hand  POSTURE COMMENTS:  No Significant postural limitations  ACTIVITY TOLERANCE: Activity tolerance: Will continue to assess within functional contexts  UPPER EXTREMITY ROM:    Active ROM Left Eval WNL throughout Right eval Right 05/24/24  Shoulder flexion  155 (180) 175  Shoulder abduction  148 (180) 180  Shoulder adduction     Shoulder extension     Shoulder internal rotation  70 90  Shoulder external rotation  70 90  Elbow flexion     Elbow extension     Wrist flexion     Wrist extension     Wrist ulnar deviation     Wrist radial deviation     Wrist pronation     Wrist supination     (Blank rows = not tested)  UPPER EXTREMITY MMT:     MMT Left Eval Right eval Right 05/24/24  Shoulder flexion 5 4 (within available active range) 4+  Shoulder abduction 5 4 (within available active range) 4+  Shoulder adduction     Shoulder extension     Shoulder internal rotation 5 4 5   Shoulder external rotation 5 4 4+  Middle trapezius     Lower trapezius     Elbow flexion 5 5 5   Elbow extension 5 5 5   Wrist flexion 5 5 5   Wrist extension 5 5 5   Wrist ulnar deviation     Wrist radial deviation     Wrist pronation     Wrist supination     (Blank rows = not tested)  HAND FUNCTION: Grip strength: Right: 65 lbs; Left: 75 lbs, Lateral pinch: Right: 14 lbs, Left: 18 lbs, and 3 point pinch: Right: 11 lbs, Left: 16 lbs 05/24/24: Grip strength: Right: 71 lbs, Left: 79 lbs; Lateral pinch: Right: 15 lbs, Left: 18 lbs; 3 point pinch: Right: 15 lbs, Left: 16 lbs  COORDINATION: Finger Nose Finger  test: Good speed and accuracy in BUEs 9 Hole Peg test: Right: 31 sec; Left: 32 sec 05/24/24: Right: 26 sec  SENSATION: Pt reports initial tingling in BUEs and BLEs, but the L side tingling is now gone, and the R side is now improving, reporting only tingling in R hand and R foot   EDEMA: No visible edema  MUSCLE TONE: RUE: Within functional limits  COGNITION: Overall cognitive status: Within functional limits for tasks assessed  VISION ASSESSMENT: Will test further within functional contexts as needed, as pt reports no changes in vision since ICH  PERCEPTION: Not tested  PRAXIS: WFL  OBSERVATIONS:  Mild R shoulder stiffness and weakness noted.  R dominant hand slightly lagging behind the L non-dominant, but pt reports good use of the R arm for all daily tasks except for tasks that require extended reach over or behind head or behind back.  TREATMENT DATE: 05/24/24: Therapeutic Activity: Objective measures taken and goals updated for discharge summary.  Therapeutic Exercise: -Red theraband for R shoulder strengthening (flex, abd, ER), green band for R elbow flex/ext x2 sets 10 reps each -Min written cues added to handout to maximize form/technique during 1st set; able to return indep demo on 2nd set following written cues  Self Care: -Review of progress towards goals -HEP reviewed, with focus on specific exercises to target based on residual weakness following MMT completed today.  PATIENT EDUCATION: Education details: HEP review, d/c recommendations, progress towards goals Person educated: Patient Education method: Explanation and Verbal cues Education comprehension: verbalized understanding and returned demonstration  HOME EXERCISE PROGRAM: Pink theraputty Dowel stretches for R shoulder flexibility  Red/green theraband for BUE strengthening  GOALS: Goals  reviewed with patient? Yes  SHORT TERM GOALS: Target date: 06/21/24  Pt will be indep to perform HEP for improving RUE strength and flexibility. Baseline: Eval: HEP initiated (putty and dowel stretches); further training needed; 05/24/24: indep with written cues on visual handout Goal status: achieved  LONG TERM GOALS: Target date: 08/02/24  Pt will increase R grip strength by 10 or more lbs in order to improve ability to open tight jars/containers.   Baseline: Eval: R 65 (L 75); 05/24/24: R 71 (L 79); pt reports no difficulties opening tight jars/containers; pt has been encouraged to continue with daily use of theraputty for continued R grip/pinch strengthening Goal status: sufficiently achieved for d/c  2.  Pt will increase R shoulder strength by 1/2 grade or more in order to improve tolerance for bimanual hair care.  Baseline: Eval: Daughter currently helps pt to put hair up in ponytail.  Pt reports she can only manage a messy ponytail (R shoulder weakness and stiffness; 4/5 grossly); 05/24/24; RUE 4+ to 5/5 (see above); pt is now indep with bimanual hair care Goal status: achieved  3.  Pt will increase R shoulder flexibility to enable reach overhead, behind head, and behind back for ADLs without reports of discomfort. Baseline: Eval: R shoulder flex 155, abd 148, IR/ER 70; pt reports discomfort with overhead reach, hair care, and pericare d/t R shoulder stiffness; 05/24/24: R shoulder ROM now WNL (see above), with pt reporting no pain/discomfort to reach in all directions. Goal status: achieved  ASSESSMENT: CLINICAL IMPRESSION: Pt seen this date for OT d/c assessment.  Pt still presents with very mild strength discrepancy in the R shoulder and R hand as compared to L non-dominant/unaffected side, though RUE strength, ROM, and coordination measures are now [facility] for pt to manage all ADL/IADL tasks independently.  Pt is now indep with HEP and understands recommendation to continue to focus on HEP to  work towards maximizing RUE strength in the R dominant arm.  All OT goals sufficiently met.  Pt in agreement with OT d/c with HEP.     PERFORMANCE DEFICITS: in functional skills including ADLs, IADLs, sensation, ROM, strength, pain, flexibility, and UE functional use, and psychosocial skills including coping strategies, environmental adaptation, and routines and behaviors.   IMPAIRMENTS: are limiting patient from ADLs, IADLs, and work.   CO-MORBIDITIES: has co-morbidities such as HTN head aches, that affects occupational performance. Patient will benefit from skilled OT to address above impairments and improve overall function.  MODIFICATION OR ASSISTANCE TO COMPLETE EVALUATION: No modification of tasks or assist necessary to complete an evaluation.  OT OCCUPATIONAL PROFILE AND HISTORY: Detailed assessment: Review of records and additional review of physical, cognitive, psychosocial history related to current functional  performance.  CLINICAL DECISION MAKING: Moderate - several treatment options, min-mod task modification necessary  REHAB POTENTIAL: Good  EVALUATION COMPLEXITY: Moderate    PLAN:  OT FREQUENCY: 1-2x/week  OT DURATION: 12 weeks  PLANNED INTERVENTIONS: 97168 OT Re-evaluation, 97535 self care/ADL training, 02889 therapeutic exercise, 97530 therapeutic activity, 97112 neuromuscular re-education, 97140 manual therapy, 97010 moist heat, 97010 cryotherapy, 97750 Physical Performance Testing, passive range of motion, psychosocial skills training, energy conservation, coping strategies training, patient/family education, and DME and/or AE instructions  RECOMMENDED OTHER SERVICES: None at this time  CONSULTED AND AGREED WITH PLAN OF CARE: Patient  PLAN FOR NEXT SESSION: HEP review, assess readiness for d/c  Inocente Blazing, MS, OTR/L  Inocente MARLA Blazing, OT 05/24/2024, 12:12 PM

## 2024-05-26 ENCOUNTER — Ambulatory Visit: Admitting: Physical Therapy

## 2024-05-31 ENCOUNTER — Ambulatory Visit

## 2024-05-31 ENCOUNTER — Encounter

## 2024-05-31 DIAGNOSIS — R262 Difficulty in walking, not elsewhere classified: Secondary | ICD-10-CM

## 2024-05-31 DIAGNOSIS — M6281 Muscle weakness (generalized): Secondary | ICD-10-CM

## 2024-05-31 DIAGNOSIS — R278 Other lack of coordination: Secondary | ICD-10-CM

## 2024-05-31 DIAGNOSIS — R2681 Unsteadiness on feet: Secondary | ICD-10-CM

## 2024-05-31 NOTE — Therapy (Signed)
 OUTPATIENT PHYSICAL THERAPY TREATMENT  Patient Name: Rebecca Andrews MRN: [id number] DOB:1981/01/27, 43 y.o., female Today's Date: 05/31/2024  PCP: Vincente Shivers, NP REFERRING PROVIDER: Vincente Shivers, NP  END OF SESSION:  PT End of Session - 05/31/24 0848     Visit Number 5    Number of Visits 16    Date for PT Re-Evaluation 06/28/24    Authorization Type BCBS COMM PRO    Authorization Time Period 05/03/24-06/28/24    Progress Note Due on Visit 10    PT Start Time 0845    PT Stop Time 0925    PT Time Calculation (min) 40 min    Activity Tolerance Patient tolerated treatment well;No increased pain    Behavior During Therapy WFL for tasks assessed/performed           Past Medical History:  Diagnosis Date   Blood transfusion without reported diagnosis    Frequent headaches    Hypertension    Stroke [facility])    No past surgical history on file. 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 Was able to advance walking to a more ambitious grocery outing.   PERTINENT HISTORY:   Rebecca Andrews (sih-MOH-nuh) Tally is a 42yoF who comes to [facility] for evaluation s/p pontine hemorrhage in July 2025. Pt experiencing Rt hemibody weakness and  paresthesia, difficulty walking, maintaining balance. Pt DC to home with 3 DTR and boyfriend with RW, moving at supervision level to modified independent level.    PAIN:  Are you having pain? No    PRECAUTIONS: None  WEIGHT BEARING RESTRICTIONS: None   FALLS: Has patient fallen in last 6 months? No  LIVING ENVIRONMENT: Lives with: boyfriend, 3 DTRs (11yo, 13yo, 17yo)  Stairs: full flight to 2nd floor Has following equipment at home: RW on 1st level, RW on 2nd level   PLOF: independent   PATIENT GOALS: return to full independence   OBJECTIVE:  Note:  Objective measures were completed at Evaluation unless otherwise noted.                                                                                                                             TREATMENT DATE 05/31/24:   , overground, no device:  x2  SLS RLE x60sec 30sec chair rise: 14x hands free 10x STS from chair with 15lb med ball  90sec normal stance on bosu (minA for mount/dismount), then forward 10lb me dball toss x8  10x STS from chair with 15lb med ball  Forward skipping x 7ft, then backward shuffle (twice with a brief DOE break)   PATIENT EDUCATION: Education details: results of goals overall and instructions for progresing home walking program. Person educated: Patient and SO  Education method: discussion and handout Education comprehension: good   HOME EXERCISE PROGRAM: Access Code: [email or web address] URL: [email or web address] Date: 05/03/2024 Prepared by: Peggye Linear  Exercises - Single Leg Stance  - 3 x daily - 1 sets - 15 reps - Narrow Stance with Eyes Closed and Head Nods  - 3 x daily - 1 sets - 3 reps - 60 hold  GOALS: Goals reviewed with patient? no  SHORT TERM GOALS: Target date: 05/23/24  Pt to demonstrate Rt SLS balance >30sec  Baseline: 12sec; 05/31/24: >60sec on first attempt  Goal status: ACHIEVED   2.  Pt to demonstrate sustained AMB without break >1067ft with device ad lib.   Baseline: 146ft ; 1314ft on 05/31/24 Goal status: ACHIEVED  3.  Pt to demonstrate improved confidence in gait AEB improvement in retro AMB by 50%.  Baseline: 25.85sec; 05/31/24: 13.26sec  Goal status: ACHIEVED  4.  Pt to demonstrate 30sec chair rise test >15x  Baseline: 05/31/24: 14x hands free  Goal status: PROGRESSING   LONG TERM GOALS: Target date: 06/28/24  Pt to demonstrate Rt SLS balance >60sec  Baseline: 12sec; 05/31/24: >60sec on first attempt  Goal status: ACHIEVED  2.  Pt to demonstrate >1771ft   Baseline: 158ft tolerance; 05/31/24: 136ft no device  Goal status: PROGRESSING  3.  Pt to demonstrate improved confidence in gait AEB improvement in retro AMB to less than 12sec.  Baseline: 25.85sec; 9.53sec;   Goal status: ACHIEVED  4.  Pt to report tolerance of AMB >1 miles for exercise walking.  Baseline: 05/31/24: getting ready to start neighborhood walking;  Goal status: PROGRESSING  5. Pt to report initiation, confidence, and compliance of LT fitness and rehab program 2 weeks prior to DC from PT services.     Baselines:    Goal Status: INITIAL  ASSESSMENT:  CLINICAL IMPRESSION: At 4 weeks since starting PT here, objective tests and measures are repeated here today. 3 of 4 ST goals met; 3 of 5 LT goals met. Pt remains most limited with community distance walking which is limited by fatigability. Pt remains off baseline but continues to steadily improve overall. Patient will benefit from skilled physical therapy  intervention to reduce deficits and impairments identified in evaluation, in order to reduce pain, improve quality of life, and maximize activity tolerance for ADL, IADL, and leisure/fitness. Physical therapy will help pt achieve long and short term goals of care.   OBJECTIVE IMPAIRMENTS: Decreased knowledge of condition, decreased use of DME, decreased mobility, difficulty walking, decreased strength, decreased ROM. ACTIVITY LIMITATIONS: Lifting,  standing, walking, squatting, transfers, locomotion level PARTICIPATION LIMITATIONS: Cleaning, laundry, interpersonal relationships, driving, yardwork, community activity.  PERSONAL FACTORS: Age, behavior pattern, education, past/current experiences, transportation, profession  are also affecting patient's functional outcome.  REHAB POTENTIAL: Good CLINICAL DECISION MAKING: Medium  EVALUATION COMPLEXITY: Moderate    PLAN:  PT FREQUENCY: 1-2x/week  PT DURATION: 8 weeks  PLANNED INTERVENTIONS: 97110-Therapeutic exercises, 97530- Therapeutic activity, [id number]- Neuromuscular re-education, 97535- Self Care, 02859- Manual therapy, [phone]- Gait training, [phone]- Electrical stimulation (unattended), [phone]- Electrical stimulation (manual), Patient/Family education, Balance training, Stair training, Joint mobilization, Joint manipulation, Cryotherapy, and Moist heat  PLAN FOR NEXT SESSION:  -Check vitals with activity to monitor DOE with moderate efforts.    8:52 AM, 05/31/24 Peggye JAYSON Linear, PT, DPT Physical Therapist - [facility]  Outpatient Physical Therapy- Main Campus [phone]

## 2024-06-02 ENCOUNTER — Ambulatory Visit

## 2024-06-02 DIAGNOSIS — R278 Other lack of coordination: Secondary | ICD-10-CM

## 2024-06-02 DIAGNOSIS — R2681 Unsteadiness on feet: Secondary | ICD-10-CM

## 2024-06-02 DIAGNOSIS — R262 Difficulty in walking, not elsewhere classified: Secondary | ICD-10-CM

## 2024-06-02 DIAGNOSIS — M6281 Muscle weakness (generalized): Secondary | ICD-10-CM

## 2024-06-02 NOTE — Therapy (Signed)
 OUTPATIENT PHYSICAL THERAPY TREATMENT  Patient Name: Rebecca Andrews MRN: [id number] DOB:June 19, 1981, 43 y.o., female Today's Date: 06/02/2024  PCP: Vincente Shivers, NP REFERRING PROVIDER: Rosemarie Eather RAMAN, MD  END OF SESSION:  PT End of Session - 06/02/24 0814     Visit Number 6    Number of Visits 16    Date for PT Re-Evaluation 06/28/24    Authorization Type BCBS COMM PRO    Authorization Time Period 05/03/24-06/28/24    Progress Note Due on Visit 10    PT Start Time 0814    PT Stop Time 0845    PT Time Calculation (min) 31 min    Activity Tolerance Patient tolerated treatment well;No increased pain    Behavior During Therapy WFL for tasks assessed/performed           Past Medical History:  Diagnosis Date   Blood transfusion without reported diagnosis    Frequent headaches    Hypertension    Stroke [facility])    History reviewed. No pertinent surgical history. 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 Was able to advance walking to a more ambitious grocery outing.   PERTINENT HISTORY:   Rebecca (sih-MOH-nuh) Andrews is a 42yoF who comes to [facility] for evaluation s/p pontine hemorrhage in July 2025. Pt experiencing Rt  hemibody weakness and paresthesia, difficulty walking, maintaining balance. Pt DC to home with 3 DTR and boyfriend with RW, moving at supervision level to modified independent level.    PAIN:  Are you having pain? No    PRECAUTIONS: None  WEIGHT BEARING RESTRICTIONS: None   FALLS: Has patient fallen in last 6 months? No  LIVING ENVIRONMENT: Lives with: boyfriend, 3 DTRs (11yo, 13yo, 17yo)  Stairs: full flight to 2nd floor Has following equipment at home: RW on 1st level, RW on 2nd level   PLOF: independent   PATIENT GOALS: return to full independence   OBJECTIVE:  Note: Objective measures were completed at Evaluation unless otherwise noted.                                                                                                                             TREATMENT DATE 06/02/24:    There.Act: Vitals pre high intensity gait training:   BP: 150/85 mm Hg, HR: 61 BPM   2 laps lower level (900'/lap) wearing 4# AW's for long distance endurance. SBA.   Vitals post 2 laps, BP: 167/86 mm Hg, HR: 68 BPM.   1 additional lap lower level post BP reading. No SOB reported. SBA.   STS: 3x10 holding 15# med ball   B farmer's carries holding 15# KB's in each UE. 2 x 3x150' laps seated rest b/t each bout.    PATIENT EDUCATION: Education details: results of goals overall and instructions for progresing home walking program. Person educated: Patient and SO  Education method: discussion and handout Education comprehension: good   HOME EXERCISE PROGRAM: Access Code: [email or web address] URL: [email or web address] Date: 05/03/2024 Prepared by: Peggye Linear  Exercises - Single Leg Stance  - 3 x daily - 1 sets - 15 reps - Narrow Stance with Eyes Closed and Head Nods  - 3 x daily - 1 sets - 3 reps - 60 hold  GOALS: Goals reviewed with patient? no  SHORT TERM GOALS: Target date: 05/23/24  Pt to demonstrate Rt SLS balance >30sec  Baseline: 12sec; 05/31/24: >60sec on first  attempt  Goal status: ACHIEVED   2.  Pt to demonstrate sustained AMB without break >1022ft with device ad lib.  Baseline: 132ft ; 1358ft on 05/31/24 Goal status: ACHIEVED  3.  Pt to demonstrate improved confidence in gait AEB improvement in retro AMB by 50%.  Baseline: 25.85sec; 05/31/24: 13.26sec  Goal status: ACHIEVED  4.  Pt to demonstrate 30sec chair rise test >15x  Baseline: 05/31/24: 14x hands free  Goal status: PROGRESSING   LONG TERM GOALS: Target date: 06/28/24  Pt to demonstrate Rt SLS balance >60sec  Baseline: 12sec; 05/31/24: >60sec on first attempt  Goal status: ACHIEVED  2.  Pt to demonstrate >1728ft   Baseline: 143ft tolerance; 05/31/24: 1352ft no device  Goal status: PROGRESSING  3.  Pt to demonstrate improved confidence in gait AEB improvement in retro AMB to less than 12sec.  Baseline: 25.85sec; 9.53sec;   Goal status: ACHIEVED  4.  Pt to report tolerance of AMB >1 miles for exercise walking.  Baseline: 05/31/24: getting ready to start neighborhood walking;  Goal status: PROGRESSING  5. Pt to report initiation, confidence, and compliance of LT fitness and rehab program 2 weeks prior to DC from PT services.     Baselines:    Goal Status: INITIAL  ASSESSMENT:  CLINICAL IMPRESSION: Pt arriving late to session limiting time spent with treatment. Continuing PT POC working on high intensity gait training/endurance. DOE appears better today per pt report with exertional ambulation. Encouraged to continue  long distance ambulation as part of HEP to improve walking tolerance for ADL's and childrens' recreational activities to maximize return to PLOF. Patient will benefit from skilled physical therapy intervention to reduce deficits and impairments identified in evaluation, in order to reduce pain, improve quality of life, and maximize activity tolerance for ADL, IADL, and leisure/fitness. Physical therapy will help pt achieve long and short term goals of care.     OBJECTIVE IMPAIRMENTS: Decreased knowledge of condition, decreased use of DME, decreased mobility, difficulty walking, decreased strength, decreased ROM. ACTIVITY LIMITATIONS: Lifting, standing, walking, squatting, transfers, locomotion level PARTICIPATION LIMITATIONS: Cleaning, laundry, interpersonal relationships, driving, yardwork, community activity.  PERSONAL FACTORS: Age, behavior pattern, education, past/current experiences, transportation, profession  are also affecting patient's functional outcome.  REHAB POTENTIAL: Good CLINICAL DECISION MAKING: Medium  EVALUATION COMPLEXITY: Moderate    PLAN:  PT FREQUENCY: 1-2x/week  PT DURATION: 8 weeks  PLANNED INTERVENTIONS: 97110-Therapeutic exercises, 97530- Therapeutic activity, [id number]- Neuromuscular re-education, 97535- Self Care, 02859- Manual therapy, [phone]- Gait training, [phone]- Electrical stimulation (unattended), [phone]- Electrical stimulation (manual), Patient/Family education, Balance training, Stair training, Joint mobilization, Joint manipulation, Cryotherapy, and Moist heat  PLAN FOR NEXT SESSION:  -Check vitals with activity to monitor DOE with moderate efforts.    Dorina HERO. Fairly IV, PT, DPT Physical Therapist- [place]  [facility] 9:07 AM, 06/02/24

## 2024-06-06 ENCOUNTER — Ambulatory Visit: Admitting: General Practice

## 2024-06-07 ENCOUNTER — Ambulatory Visit

## 2024-06-07 ENCOUNTER — Encounter

## 2024-06-07 DIAGNOSIS — R262 Difficulty in walking, not elsewhere classified: Secondary | ICD-10-CM

## 2024-06-07 DIAGNOSIS — M6281 Muscle weakness (generalized): Secondary | ICD-10-CM | POA: Diagnosis not present

## 2024-06-07 DIAGNOSIS — R2681 Unsteadiness on feet: Secondary | ICD-10-CM

## 2024-06-07 DIAGNOSIS — R278 Other lack of coordination: Secondary | ICD-10-CM

## 2024-06-07 NOTE — Therapy (Signed)
 OUTPATIENT PHYSICAL THERAPY TREATMENT  Patient Name: Rebecca Andrews MRN: [id number] DOB:05-28-1981, 43 y.o., female Today's Date: 06/07/2024  PCP: Vincente Shivers, NP REFERRING PROVIDER: Rosemarie Eather RAMAN, MD  END OF SESSION:  PT End of Session - 06/07/24 0822     Visit Number 7    Number of Visits 16    Date for PT Re-Evaluation 06/28/24    Authorization Type BCBS COMM PRO    Authorization Time Period 05/03/24-06/28/24    Progress Note Due on Visit 10    PT Start Time 0817    PT Stop Time 0845    PT Time Calculation (min) 28 min    Activity Tolerance Patient tolerated treatment well;No increased pain    Behavior During Therapy WFL for tasks assessed/performed           Past Medical History:  Diagnosis Date   Blood transfusion without reported diagnosis    Frequent headaches    Hypertension    Stroke [facility])    No past surgical history on file. 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   ESSENTIAL HYPERTENSION, BENIGN 09/02/2010   ONSET DATE: July 2025  REFERRING DIAG: Pontine Hemorrhage with Rt hemi weakness THERAPY DIAG:  Muscle weakness (generalized)  Other lack of coordination  Difficulty in walking, not elsewhere classified  Unsteadiness on feet  Rationale for Evaluation and Treatment: Rehabilitation  SUBJECTIVE:                                                                                                                                                                                          SUBJECTIVE STATEMENT: Pt did a walk at [employer] and was out for a long time, it went really well.  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   PERTINENT HISTORY:   Rebecca (sih-MOH-nuh) Andrews is a 42yoF who comes to [facility] for evaluation s/p pontine hemorrhage in July 2025. Pt  experiencing Rt hemibody weakness and paresthesia, difficulty walking, maintaining balance. Pt DC to home with 3 DTR and boyfriend with RW, moving at supervision level to modified independent level.    PAIN:  Are you having pain? No    PRECAUTIONS: None  WEIGHT BEARING RESTRICTIONS: None   FALLS: Has patient fallen in last 6 months? No  LIVING ENVIRONMENT: Lives with: boyfriend, 3 DTRs (11yo, 13yo, 17yo)  Stairs: full flight to 2nd floor Has following equipment at home: RW on 1st level, RW on 2nd level   PLOF:  independent   PATIENT GOALS: return to full independence   OBJECTIVE:  Note: Objective measures were completed at evaluation unless otherwise noted.                                                                                                                             TREATMENT DATE 06/07/24:    -HIT circuit: 15x STS c 15lb ball, 72ft of 45lb sled push, 139ft skipping (2 times)  *Rest -123ft overground with 15lb med ball, then another 123ft with 2 15lb KB  *rest  -15lb ball deadlift and toss, the side suffle to left to ball, throw back, Rt side suffle back (2x each), then lift toss fwd run, lift toss backward run *rest -10lb med ball toss, skip to ball, lift toss, skip to ball   PATIENT EDUCATION: Education details: results of goals overall and instructions for progresing home walking program. Person educated: Patient and SO  Education method: discussion and handout Education comprehension: good   HOME EXERCISE PROGRAM: Access Code: [email or web address] URL: [email or web address] Date: 05/03/2024 Prepared by: Peggye Linear  Exercises - Single Leg Stance  - 3 x daily - 1 sets - 15 reps - Narrow Stance with Eyes Closed and Head Nods  - 3 x daily - 1 sets - 3 reps - 60 hold  GOALS: Goals reviewed with patient? no  SHORT TERM GOALS: Target date: 05/23/24  Pt to demonstrate Rt SLS balance >30sec  Baseline: 12sec; 05/31/24: >60sec on first attempt  Goal  status: ACHIEVED   2.  Pt to demonstrate sustained AMB without break >1085ft with device ad lib.  Baseline: 140ft ; 1354ft on 05/31/24 Goal status: ACHIEVED  3.  Pt to demonstrate improved confidence in gait AEB improvement in retro AMB by 50%.  Baseline: 25.85sec; 05/31/24: 13.26sec  Goal status: ACHIEVED  4.  Pt to demonstrate 30sec chair rise test >15x  Baseline: 05/31/24: 14x hands free  Goal status: PROGRESSING   LONG TERM GOALS: Target date: 06/28/24  Pt to demonstrate Rt SLS balance >60sec  Baseline: 12sec; 05/31/24: >60sec on first attempt  Goal status: ACHIEVED  2.  Pt to demonstrate >1784ft   Baseline: 169ft tolerance; 05/31/24: 1350ft no device  Goal status: PROGRESSING  3.  Pt to demonstrate improved confidence in gait AEB improvement in retro AMB to less than 12sec.  Baseline: 25.85sec; 9.53sec;   Goal status: ACHIEVED  4.  Pt to report tolerance of AMB >1 miles for exercise walking.  Baseline: 05/31/24: getting ready to start neighborhood walking;  Goal status: PROGRESSING  5. Pt to report initiation, confidence, and compliance of LT fitness and rehab program 2 weeks prior to DC from PT services.     Baselines:    Goal Status: INITIAL  ASSESSMENT:  CLINICAL IMPRESSION: Discussion with pt about multiple late arrivals- pt having transportation difficulty that she is working on. Encouraged her to take advantage of her entire appointment time. Pt continues to advance  HIT training, res breaks provided as needed for DOE recovery. Mobility in community continues to improve overall. Patient will benefit from skilled physical therapy intervention to reduce deficits and impairments identified in evaluation, in order to reduce pain, improve quality of life, and maximize activity tolerance for ADL, IADL, and leisure/fitness. Physical therapy will help pt achieve long and short term goals of care.    OBJECTIVE IMPAIRMENTS: Decreased knowledge of condition,  decreased use of DME, decreased mobility, difficulty walking, decreased strength, decreased ROM. ACTIVITY LIMITATIONS: Lifting, standing, walking, squatting, transfers, locomotion level PARTICIPATION LIMITATIONS: Cleaning, laundry, interpersonal relationships, driving, yardwork, community activity.  PERSONAL FACTORS: Age, behavior pattern, education, past/current experiences, transportation, profession  are also affecting patient's functional outcome.  REHAB POTENTIAL: Good CLINICAL DECISION MAKING: Medium  EVALUATION COMPLEXITY: Moderate    PLAN:  PT FREQUENCY: 1-2x/week PT DURATION: 8 weeks PLANNED INTERVENTIONS: 97110-Therapeutic exercises, 97530- Therapeutic activity, [id number]- Neuromuscular re-education, 97535- Self Care, 02859- Manual therapy, [phone]- Gait training, [phone]- Electrical stimulation (unattended), [phone]- Electrical stimulation (manual), Patient/Family education, Balance training, Stair training, Joint mobilization, Joint manipulation, Cryotherapy, and Moist heat  PLAN FOR NEXT SESSION:  -Check vitals with activity to monitor DOE with moderate efforts.   8:25 AM, 06/07/24 Peggye JAYSON Linear, PT, DPT Physical Therapist - [facility]  Outpatient Physical Therapy- Main Campus [phone]

## 2024-06-09 ENCOUNTER — Encounter

## 2024-06-09 ENCOUNTER — Ambulatory Visit

## 2024-06-14 ENCOUNTER — Encounter

## 2024-06-14 ENCOUNTER — Ambulatory Visit

## 2024-06-14 DIAGNOSIS — R2681 Unsteadiness on feet: Secondary | ICD-10-CM

## 2024-06-14 DIAGNOSIS — R278 Other lack of coordination: Secondary | ICD-10-CM

## 2024-06-14 DIAGNOSIS — M6281 Muscle weakness (generalized): Secondary | ICD-10-CM | POA: Diagnosis not present

## 2024-06-14 DIAGNOSIS — R262 Difficulty in walking, not elsewhere classified: Secondary | ICD-10-CM

## 2024-06-14 NOTE — Therapy (Signed)
 OUTPATIENT PHYSICAL THERAPY TREATMENT  Patient Name: Rebecca Andrews MRN: [id number] DOB:1980-11-26, 43 y.o., female Today's Date: 06/14/2024  PCP: Vincente Shivers, NP REFERRING PROVIDER: Rosemarie Eather RAMAN, MD  END OF SESSION:  PT End of Session - 06/14/24 0855     Visit Number 8    Number of Visits 16    Date for Recertification  06/28/24    Authorization Type BCBS COMM PRO    Authorization Time Period 05/03/24-06/28/24    Progress Note Due on Visit 10    PT Start Time 0850    PT Stop Time 0930    PT Time Calculation (min) 40 min    Equipment Utilized During Treatment Gait belt    Activity Tolerance Patient tolerated treatment well;No increased pain    Behavior During Therapy WFL for tasks assessed/performed           Past Medical History:  Diagnosis Date   Blood transfusion without reported diagnosis    Frequent headaches    Hypertension    Stroke [facility])    No past surgical history on file. 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   PERTINENT HISTORY:   Rebecca (sih-MOH-nuh) Andrews is a 42yoF who comes to [facility] for evaluation s/p pontine hemorrhage in July 2025. Pt experiencing Rt  hemibody weakness and paresthesia, difficulty walking, maintaining balance. Pt DC to home with 3 DTR and boyfriend with RW, moving at supervision level to modified independent level.    PAIN:  Are you having pain? No    PRECAUTIONS: None  WEIGHT BEARING RESTRICTIONS: None   FALLS: Has patient fallen in last 6 months? No  LIVING ENVIRONMENT: Lives with: boyfriend, 3 DTRs (11yo, 13yo, 17yo)  Stairs: full flight to 2nd floor Has following equipment at home: RW on 1st level, RW on 2nd level   PLOF: independent   PATIENT GOALS: return to full  independence   OBJECTIVE:  Note: Objective measures were completed at evaluation unless otherwise noted.                                                                                                                             TREATMENT DATE 06/14/24:   -STS from slight elevation, 15lb med ball x15 -eyes closed on airex pad x60sec  -STS from slight elevation, 15lb med ball x15 -eyes closed on airex pad x60sec   -pt loaded with 3lb AW bilat and 2lb wrist weights bilat, overground AMB on LL and floor 1, up/down 26 stairs, ramp, and 266ft of pushing author in transport chair (13 minutes total)   -cable resisted walking with red mat firm step up/down: 12.5lb 1x each direction: red mat, 4 step up/down, 8* rocker board step, airex pad, 3 half rolls. 172ft total   PATIENT EDUCATION: Education details: results of goals overall and instructions for progresing home walking program. Person educated: Patient and SO  Education method: discussion and handout Education comprehension: good   HOME EXERCISE PROGRAM: Access Code: [email or web address] URL: [email or web address] Date: 05/03/2024 Prepared by: Peggye Linear  Exercises - Single Leg Stance  - 3 x daily - 1 sets - 15 reps - Narrow Stance with Eyes Closed and Head Nods  - 3 x daily - 1 sets - 3 reps - 60 hold  GOALS: Goals reviewed with patient? no  SHORT TERM GOALS: Target date:  05/23/24  Pt to demonstrate Rt SLS balance >30sec  Baseline: 12sec; 05/31/24: >60sec on first attempt  Goal status: ACHIEVED   2.  Pt to demonstrate sustained AMB without break >1069ft with device ad lib.  Baseline: 156ft ; 135ft on 05/31/24 Goal status: ACHIEVED  3.  Pt to demonstrate improved confidence in gait AEB improvement in retro AMB by 50%.  Baseline: 25.85sec; 05/31/24: 13.26sec  Goal status: ACHIEVED  4.  Pt to demonstrate 30sec chair rise test >15x  Baseline: 05/31/24: 14x hands free  Goal status: PROGRESSING   LONG TERM GOALS: Target date: 06/28/24  Pt to demonstrate Rt SLS balance >60sec  Baseline: 12sec; 05/31/24: >60sec on first attempt  Goal status: ACHIEVED  2.  Pt to demonstrate >1723ft   Baseline: 119ft tolerance; 05/31/24: 1367ft no device  Goal status: PROGRESSING  3.  Pt to demonstrate improved confidence in gait AEB improvement in retro AMB to less than 12sec.  Baseline: 25.85sec; 9.53sec;   Goal status: ACHIEVED  4.  Pt to report tolerance of AMB >1 miles for exercise walking.  Baseline: 05/31/24: getting ready to start neighborhood walking;  Goal status: PROGRESSING  5. Pt to report initiation, confidence, and compliance of LT fitness and rehab program 2 weeks prior to DC from PT services.     Baselines:    Goal Status: INITIAL  ASSESSMENT:  CLINICAL IMPRESSION: Conitnues to make gains overall. Mobility in community continues to improve overall. Notable improvements in DOE during sustained resisted AMB.  Patient will benefit from skilled physical therapy intervention to reduce deficits and impairments identified in evaluation, in order to reduce pain, improve quality of life, and maximize activity tolerance for ADL, IADL, and leisure/fitness. Physical therapy will help pt achieve long and short term goals of care.    OBJECTIVE IMPAIRMENTS: Decreased knowledge of condition, decreased use of DME, decreased mobility, difficulty walking,  decreased strength, decreased ROM. ACTIVITY LIMITATIONS: Lifting, standing, walking, squatting, transfers, locomotion level PARTICIPATION LIMITATIONS: Cleaning, laundry, interpersonal relationships, driving, yardwork, community activity.  PERSONAL FACTORS: Age, behavior pattern, education, past/current experiences, transportation, profession  are also affecting patient's functional outcome.  REHAB POTENTIAL: Good CLINICAL DECISION MAKING: Medium  EVALUATION COMPLEXITY: Moderate    PLAN:  PT FREQUENCY: 1-2x/week PT DURATION: 8 weeks PLANNED INTERVENTIONS: 97110-Therapeutic exercises, 97530- Therapeutic activity, 97112- Neuromuscular re-education, 97535- Self Care, 02859- Manual therapy, [phone]- Gait training, [phone]- Electrical stimulation (unattended), [phone]- Electrical stimulation (manual), Patient/Family education, Balance training, Stair training, Joint mobilization, Joint manipulation, Cryotherapy, and Moist heat  PLAN FOR NEXT SESSION:     8:57 AM, 06/14/24 Peggye JAYSON Linear, PT, DPT Physical Therapist - [facility]  Outpatient Physical Therapy- Main Campus [phone]

## 2024-06-16 ENCOUNTER — Encounter

## 2024-06-16 ENCOUNTER — Encounter: Payer: Self-pay | Admitting: General Practice

## 2024-06-16 ENCOUNTER — Ambulatory Visit

## 2024-06-16 DIAGNOSIS — R2681 Unsteadiness on feet: Secondary | ICD-10-CM

## 2024-06-16 DIAGNOSIS — R262 Difficulty in walking, not elsewhere classified: Secondary | ICD-10-CM

## 2024-06-16 DIAGNOSIS — M6281 Muscle weakness (generalized): Secondary | ICD-10-CM | POA: Diagnosis not present

## 2024-06-16 DIAGNOSIS — R278 Other lack of coordination: Secondary | ICD-10-CM

## 2024-06-16 NOTE — Therapy (Signed)
 OUTPATIENT PHYSICAL THERAPY TREATMENT  Patient Name: Rebecca Andrews MRN: [id number] DOB:1981/07/09, 43 y.o., female Today's Date: 06/16/2024  PCP: Vincente Shivers, NP REFERRING PROVIDER: Rosemarie Eather RAMAN, MD  END OF SESSION:  PT End of Session - 06/16/24 0816     Visit Number 9    Number of Visits 16    Date for Recertification  06/28/24    Authorization Type BCBS COMM PRO    Authorization Time Period 05/03/24-06/28/24    Progress Note Due on Visit 10    PT Start Time 0810    PT Stop Time 0840    PT Time Calculation (min) 30 min    Equipment Utilized During Treatment Gait belt    Activity Tolerance Patient tolerated treatment well;No increased pain    Behavior During Therapy WFL for tasks assessed/performed           Past Medical History:  Diagnosis Date   Blood transfusion without reported diagnosis    Frequent headaches    Hypertension    Stroke [facility])    No past surgical history on file. 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 Felt good after last PT session.   PERTINENT HISTORY:   Rebecca (sih-MOH-nuh) Andrews is a 42yoF who comes to [facility] for evaluation s/p pontine hemorrhage in July 2025. Pt experiencing Rt hemibody  weakness and paresthesia, difficulty walking, maintaining balance. Pt DC to home with 3 DTR and boyfriend with RW, moving at supervision level to modified independent level.    PAIN:  Are you having pain? No    PRECAUTIONS: None  WEIGHT BEARING RESTRICTIONS: None   FALLS: Has patient fallen in last 6 months? No  LIVING ENVIRONMENT: Lives with: boyfriend, 3 DTRs (11yo, 13yo, 17yo)  Stairs: full flight to 2nd floor Has following equipment at home: RW on 1st level, RW on 2nd level   PLOF: independent   PATIENT GOALS: return to full independence  OBJECTIVE:  Note: Objective measures were completed at evaluation unless otherwise noted.                                                                                                                             TREATMENT DATE 06/16/24:   -Nustep WU, then 45 sec max effort level 6 x45 sec, level 5 45sec, level 5 45sec -Cable backward pull and return (rams horns) 2x34ft @22 .5, 3rd at 27.5lb  Balance course with 2x4, red soft mat, 16 step up/down, 3x c 2.5lb AW bilat  -Y balance test practice pushing 2.5lb AW on floor each way    PATIENT EDUCATION: Education details: results of goals overall and instructions for progresing home walking program. Person educated: Patient and SO  Education method: discussion and handout Education comprehension: good   HOME EXERCISE PROGRAM: Access Code: [email or web address] URL: [email or web address] Date: 05/03/2024 Prepared by: Peggye Linear  Exercises - Single Leg Stance  - 3 x daily - 1 sets - 15 reps - Narrow Stance with Eyes Closed and Head Nods  - 3 x daily - 1 sets - 3 reps - 60 hold  GOALS: Goals reviewed with patient? no  SHORT TERM GOALS: Target date: 05/23/24  Pt to demonstrate Rt SLS balance >30sec  Baseline: 12sec; 05/31/24: >60sec on first attempt  Goal status: ACHIEVED   2.  Pt to demonstrate sustained AMB without break >1079ft with device ad lib.  Baseline: 158ft ; 1383ft on  05/31/24 Goal status: ACHIEVED  3.  Pt to demonstrate improved confidence in gait AEB improvement in retro AMB by 50%.  Baseline: 25.85sec; 05/31/24: 13.26sec  Goal status: ACHIEVED  4.  Pt to demonstrate 30sec chair rise test >15x  Baseline: 05/31/24: 14x hands free  Goal status: PROGRESSING   LONG TERM GOALS: Target date: 06/28/24  Pt to demonstrate Rt SLS balance >60sec  Baseline: 12sec; 05/31/24: >60sec on first attempt  Goal status: ACHIEVED  2.  Pt to demonstrate >1734ft   Baseline: 1109ft tolerance; 05/31/24: 1345ft no device  Goal status: PROGRESSING  3.  Pt to demonstrate improved confidence in gait AEB improvement in retro AMB to less than 12sec.  Baseline: 25.85sec; 9.53sec;   Goal status: ACHIEVED  4.  Pt to report tolerance of AMB >1 miles for exercise walking.  Baseline: 05/31/24: getting ready to start neighborhood walking;  Goal status: PROGRESSING  5. Pt to report initiation, confidence, and compliance of LT fitness and rehab program 2 weeks prior to DC from PT services.     Baselines:    Goal Status: INITIAL  ASSESSMENT:  CLINICAL IMPRESSION: Used Nustep today for trial of HIT training for max BDNF stimulation. Continues to make gains overall. Mobility in community continues to improve overall. Notable improvements in DOE during sustained resisted AMB. Patient will benefit from skilled physical therapy intervention to reduce deficits and impairments identified in evaluation, in order to reduce pain, improve quality of life, and maximize activity  tolerance for ADL, IADL, and leisure/fitness. Physical therapy will help pt achieve long and short term goals of care.    OBJECTIVE IMPAIRMENTS: Decreased knowledge of condition, decreased use of DME, decreased mobility, difficulty walking, decreased strength, decreased ROM. ACTIVITY LIMITATIONS: Lifting, standing, walking, squatting, transfers, locomotion level PARTICIPATION LIMITATIONS: Cleaning, laundry,  interpersonal relationships, driving, yardwork, community activity.  PERSONAL FACTORS: Age, behavior pattern, education, past/current experiences, transportation, profession  are also affecting patient's functional outcome.  REHAB POTENTIAL: Good CLINICAL DECISION MAKING: Medium  EVALUATION COMPLEXITY: Moderate    PLAN:  PT FREQUENCY: 1-2x/week PT DURATION: 8 weeks PLANNED INTERVENTIONS: 97110-Therapeutic exercises, 97530- Therapeutic activity, 97112- Neuromuscular re-education, 97535- Self Care, 02859- Manual therapy, [phone]- Gait training, [phone]- Electrical stimulation (unattended), [phone]- Electrical stimulation (manual), Patient/Family education, Balance training, Stair training, Joint mobilization, Joint manipulation, Cryotherapy, and Moist heat  PLAN FOR NEXT SESSION:     8:18 AM, 06/16/24 Peggye JAYSON Linear, PT, DPT Physical Therapist - [facility]  Outpatient Physical Therapy- Main Campus [phone]

## 2024-06-21 ENCOUNTER — Encounter

## 2024-06-21 ENCOUNTER — Ambulatory Visit

## 2024-06-21 DIAGNOSIS — M6281 Muscle weakness (generalized): Secondary | ICD-10-CM | POA: Diagnosis not present

## 2024-06-21 DIAGNOSIS — R262 Difficulty in walking, not elsewhere classified: Secondary | ICD-10-CM

## 2024-06-21 DIAGNOSIS — R2681 Unsteadiness on feet: Secondary | ICD-10-CM

## 2024-06-21 DIAGNOSIS — R278 Other lack of coordination: Secondary | ICD-10-CM

## 2024-06-21 NOTE — Therapy (Signed)
 OUTPATIENT PHYSICAL THERAPY TREATMENT/DISHCARGE  Patient Name: Rebecca Andrews MRN: [id number] DOB:03-14-1981, 43 y.o., female Today's Date: 06/21/2024  PCP: Vincente Shivers, NP REFERRING PROVIDER: Rosemarie Eather RAMAN, MD  END OF SESSION:  PT End of Session - 06/21/24 0856     Visit Number 10    Number of Visits 16    Date for Recertification  06/28/24    Authorization Type BCBS COMM PRO    Authorization Time Period 05/03/24-06/28/24    Progress Note Due on Visit 10    PT Start Time 0857    PT Stop Time 0927    PT Time Calculation (min) 30 min    Equipment Utilized During Treatment Gait belt    Activity Tolerance Patient tolerated treatment well;No increased pain    Behavior During Therapy WFL for tasks assessed/performed           Past Medical History:  Diagnosis Date   Blood transfusion without reported diagnosis    Frequent headaches    Hypertension    Stroke [facility])    No past surgical history on file. 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 First day of work got postponed until Oct 1.   PERTINENT HISTORY:   Rebecca (sih-MOH-nuh) Andrews is a 42yoF who comes to [facility] for evaluation s/p pontine hemorrhage in July 2025. Pt  experiencing Rt hemibody weakness and paresthesia, difficulty walking, maintaining balance. Pt DC to home with 3 DTR and boyfriend with RW, moving at supervision level to modified independent level.    PAIN:  Are you having pain? No    PRECAUTIONS: None  WEIGHT BEARING RESTRICTIONS: None   FALLS: Has patient fallen in last 6 months? No  LIVING ENVIRONMENT: Lives with: boyfriend, 3 DTRs (11yo, 13yo, 17yo)  Stairs: full flight to 2nd floor Has following equipment at home: RW on 1st level, RW on 2nd level   PLOF: independent   PATIENT GOALS: return to  full independence   OBJECTIVE:  Note: Objective measures were completed at evaluation unless otherwise noted.                                                                                                                             TREATMENT DATE 06/21/24:   -donned 2lb AW bilat, 1lb WR bilat, then 10 minutes AMB in hospital including 54 stairs up and 2x26 stairs down.  -max speed sled push HIIT: 70lb sled, 4x65ft (10-11sec average, pt jogging)  -heavy object lift, carry transfer including 10-15lb items, including a section of cushy red mat, 6 step up, balance beam  PATIENT EDUCATION: Education details: results of goals overall and instructions for progresing home walking program. Person educated: Patient and SO  Education method: discussion and handout Education comprehension: good   HOME EXERCISE PROGRAM: Access Code: [email or web address] URL: [email or web address] Date: 05/03/2024 Prepared by: Peggye Linear  Exercises - Single Leg Stance  - 3 x daily - 1 sets - 15 reps - Narrow Stance with Eyes Closed and Head Nods  - 3 x daily - 1 sets - 3 reps - 60 hold  GOALS: Goals reviewed with patient? no  SHORT TERM GOALS: Target date: 05/23/24  Pt to demonstrate Rt SLS balance >30sec  Baseline: 12sec; 05/31/24: >60sec on first attempt  Goal status: ACHIEVED   2.  Pt to demonstrate sustained AMB without break >1072ft with  device ad lib.  Baseline: 165ft ; 1358ft on 05/31/24 Goal status: ACHIEVED  3.  Pt to demonstrate improved confidence in gait AEB improvement in retro AMB by 50%.  Baseline: 25.85sec; 05/31/24: 13.26sec  Goal status: ACHIEVED  4.  Pt to demonstrate 30sec chair rise test >15x  Baseline: 05/31/24: 14x hands free; 06/21/24: 18x!   Goal status: ACHIEVED   LONG TERM GOALS: Target date: 06/28/24  Pt to demonstrate Rt SLS balance >60sec  Baseline: 12sec; 05/31/24: >60sec on first attempt  Goal status: ACHIEVED  2.  Pt to demonstrate >1788ft   Baseline: 173ft tolerance; 05/31/24: 1345ft no device  Goal status: PROGRESSING  3.  Pt to demonstrate improved confidence in gait AEB improvement in retro AMB to less than 12sec.  Baseline: 25.85sec; 9.53sec;   Goal status: ACHIEVED  4.  Pt to report tolerance of AMB >1 miles for exercise walking.  Baseline: 05/31/24: getting ready to start neighborhood walking;  Goal status: PROGRESSING  5. Pt to report initiation, confidence, and compliance of LT fitness and rehab program 2 weeks prior to DC from PT services.     Baselines:    Goal Status: ACHIEVED   ASSESSMENT:  CLINICAL IMPRESSION: Advanced AMB overground to include 4 flights of stairs up and down. Pt able to increase sled push to 70lb with improved time. Pt feels ready for DC at this time. All goals of care met. Pt has made remarkable progress since arrival.    OBJECTIVE IMPAIRMENTS: Decreased knowledge of condition, decreased use of DME,  decreased mobility, difficulty walking, decreased strength, decreased ROM. ACTIVITY LIMITATIONS: Lifting, standing, walking, squatting, transfers, locomotion level PARTICIPATION LIMITATIONS: Cleaning, laundry, interpersonal relationships, driving, yardwork, community activity.  PERSONAL FACTORS: Age, behavior pattern, education, past/current experiences, transportation, profession  are also affecting patient's functional outcome.  REHAB  POTENTIAL: Good CLINICAL DECISION MAKING: Medium  EVALUATION COMPLEXITY: Moderate    PLAN:  PT FREQUENCY: 1-2x/week PT DURATION: 8 weeks PLANNED INTERVENTIONS: 97110-Therapeutic exercises, 97530- Therapeutic activity, 97112- Neuromuscular re-education, 97535- Self Care, 02859- Manual therapy, [phone]- Gait training, [phone]- Electrical stimulation (unattended), [phone]- Electrical stimulation (manual), Patient/Family education, Balance training, Stair training, Joint mobilization, Joint manipulation, Cryotherapy, and Moist heat  PLAN FOR NEXT SESSION:     8:59 AM, 06/21/24 Peggye JAYSON Linear, PT, DPT Physical Therapist - [facility]  Outpatient Physical Therapy- Main Campus [phone]

## 2024-06-23 ENCOUNTER — Encounter

## 2024-06-23 ENCOUNTER — Ambulatory Visit

## 2024-06-28 ENCOUNTER — Encounter

## 2024-06-28 ENCOUNTER — Ambulatory Visit

## 2024-06-30 ENCOUNTER — Encounter

## 2024-06-30 ENCOUNTER — Ambulatory Visit

## 2024-07-05 ENCOUNTER — Encounter

## 2024-07-05 ENCOUNTER — Ambulatory Visit

## 2024-07-07 ENCOUNTER — Encounter

## 2024-07-07 ENCOUNTER — Ambulatory Visit

## 2024-07-08 ENCOUNTER — Ambulatory Visit: Admitting: General Practice

## 2024-07-12 ENCOUNTER — Ambulatory Visit

## 2024-07-12 ENCOUNTER — Encounter

## 2024-07-13 ENCOUNTER — Ambulatory Visit: Admitting: General Practice

## 2024-07-13 ENCOUNTER — Encounter: Payer: Self-pay | Admitting: General Practice

## 2024-07-13 VITALS — BP 116/78 | HR 65 | Temp 98.3°F | Ht 62.0 in | Wt 194.0 lb

## 2024-07-13 DIAGNOSIS — Z23 Encounter for immunization: Secondary | ICD-10-CM

## 2024-07-13 DIAGNOSIS — Z0000 Encounter for general adult medical examination without abnormal findings: Secondary | ICD-10-CM | POA: Diagnosis not present

## 2024-07-13 DIAGNOSIS — Z124 Encounter for screening for malignant neoplasm of cervix: Secondary | ICD-10-CM

## 2024-07-13 DIAGNOSIS — I10 Essential (primary) hypertension: Secondary | ICD-10-CM

## 2024-07-13 DIAGNOSIS — Z1231 Encounter for screening mammogram for malignant neoplasm of breast: Secondary | ICD-10-CM | POA: Diagnosis not present

## 2024-07-13 DIAGNOSIS — Z1159 Encounter for screening for other viral diseases: Secondary | ICD-10-CM

## 2024-07-13 DIAGNOSIS — Z833 Family history of diabetes mellitus: Secondary | ICD-10-CM

## 2024-07-13 DIAGNOSIS — D508 Other iron deficiency anemias: Secondary | ICD-10-CM

## 2024-07-13 DIAGNOSIS — K219 Gastro-esophageal reflux disease without esophagitis: Secondary | ICD-10-CM

## 2024-07-13 NOTE — Progress Notes (Signed)
 Established Patient Office Visit  Subjective   Patient ID: Rebecca Andrews, female    DOB: [birth year]  Age: 43 y.o. MRN: [id number]  Chief Complaint  Patient presents with   Annual Exam    Labs done    HPI Rebecca Andrews is a 43 year old female with past medical history of HTN,  GERD, pontine hemorrhage presents today for complete physical and follow up of chronic conditions.  Immunizations: -Tetanus: Completed in 2013; due -Influenza: due; declines - HPV: Unsure  Diet: Fair diet.  Exercise:  regular exercise.  Eye exam: Completed several years ago.  Dental exam: Completed several years ago.   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 No pertinent surgical history. No Known Allergies       07/13/2024    3:01 PM 05/05/2024   12:36 PM  Depression screen PHQ 2/9  Decreased Interest 0 0  Down, Depressed, Hopeless 0 0  PHQ - 2 Score 0 0  Altered sleeping 0 0  Tired, decreased energy 0 0  Change in appetite 0 0  Feeling bad or failure about yourself  0 0  Trouble concentrating 0 0  Moving slowly or fidgety/restless 0 0  Suicidal thoughts 0 0  PHQ-9 Score 0 0  Difficult doing work/chores Not difficult at all Not difficult at all       07/13/2024    3:01 PM 05/05/2024   12:36 PM  GAD 7 : Generalized Anxiety Score  Nervous, Anxious, on Edge 0 0  Control/stop worrying 0 0  Worry too much - different things 0 0  Trouble relaxing 0 0  Restless 0 0  Easily annoyed or irritable 0 0  Afraid - awful might happen 0 0  Total GAD  7 Score 0 0  Anxiety Difficulty Not difficult at all Not difficult at all      Review of Systems  Constitutional:  Negative for chills, fever, malaise/fatigue and weight loss.  HENT:  Negative for congestion, ear discharge, ear pain, hearing loss, nosebleeds, sinus pain, sore throat and tinnitus.   Eyes:  Negative for blurred vision, double vision, pain, discharge and redness.  Respiratory:  Negative for cough, shortness of breath, wheezing and stridor.   Cardiovascular:  Negative for chest pain, palpitations and leg swelling.  Gastrointestinal:  Negative for abdominal pain, constipation, diarrhea, heartburn, nausea and vomiting.  Genitourinary:  Negative for dysuria, frequency and urgency.  Musculoskeletal:  Negative for myalgias.  Skin:  Negative for rash.  Neurological:  Negative for dizziness, tingling, seizures, weakness and headaches.  Psychiatric/Behavioral:  Negative for depression, substance abuse and suicidal ideas. The patient is not nervous/anxious.       Objective:     BP 116/78   Pulse 65   Temp 98.3 F (36.8 C) (Temporal)   Ht 5' 2 (1.575 m)   Wt 194 lb (88 kg)   SpO2 99%   BMI 35.48 kg/m  BP Readings from Last 3 Encounters:  07/13/24 116/78  05/05/24 124/68  04/18/24 138/72   Wt Readings from Last 3  Encounters:  07/13/24 194 lb (88 kg)  05/05/24 197 lb (89.4 kg)  04/14/24 202 lb (91.6 kg)      Physical Exam Vitals and nursing note reviewed.  Constitutional:      Appearance: Normal appearance.  HENT:     Head: Normocephalic and atraumatic.     Right Ear: Tympanic membrane, ear canal and external ear normal.     Left Ear: Tympanic membrane, ear canal and external ear normal.     Nose: Nose normal.     Mouth/Throat:     Mouth: Mucous membranes are moist.     Pharynx: Oropharynx is clear.  Eyes:     Conjunctiva/sclera: Conjunctivae normal.     Pupils: Pupils are equal, round, and reactive to light.  Cardiovascular:     Rate and Rhythm: Normal  rate and regular rhythm.     Pulses: Normal pulses.     Heart sounds: Normal heart sounds.  Pulmonary:     Effort: Pulmonary effort is normal.     Breath sounds: Normal breath sounds.  Abdominal:     General: Abdomen is flat. Bowel sounds are normal.     Palpations: Abdomen is soft.  Musculoskeletal:        General: Normal range of motion.     Cervical back: Normal range of motion.  Skin:    General: Skin is warm and dry.     Capillary Refill: Capillary refill takes less than 2 seconds.  Neurological:     General: No focal deficit present.     Mental Status: She is alert and oriented to person, place, and time. Mental status is at baseline.  Psychiatric:        Mood and Affect: Mood normal.        Behavior: Behavior normal.        Thought Content: Thought content normal.        Judgment: Judgment normal.      No results found for any visits on 07/13/24.     The ASCVD Risk score (Arnett DK, et al., 2019) failed to calculate for the following reasons:   Risk score cannot be calculated because patient has a medical history suggesting prior/existing ASCVD    Assessment & Plan:  Encounter for screening and preventative care Assessment & Plan: Immunizations  Pap smear due Mammogram due, orders placed.   Discussed the importance of a healthy diet and regular exercise in order for weight loss, and to reduce the risk of further co-morbidity.  Exam stable. Labs reviewed.  Follow up in 1 year for repeat physical.   Orders: -     TSH  Screening for cervical cancer -     Ambulatory referral to Gynecology  Encounter for screening mammogram for malignant neoplasm of breast -     3D Screening Mammogram, Left and Right; Future  Other iron deficiency anemia -     CBC -     Iron and TIBC  Need for Tdap vaccination -     Tdap vaccine greater than or equal to 7yo IM  Essential hypertension, benign Assessment & Plan: Controlled.  Continue Amlodipine  10 mg once daily,  labetalol  300 mg TID and Lisinopril  20 mg once daily.   BMP pending.  Orders: -     Basic metabolic panel with GFR  Need for hepatitis C screening test -     Hepatitis C antibody  Gastroesophageal reflux disease, unspecified whether esophagitis present Assessment & Plan: Controlled without meds.   Family history  of diabetes mellitus Assessment & Plan: A1c in July 2025.  Controlled.     Return in about 6 months (around 01/11/2025) for HTN.    Carrol Aurora, NP

## 2024-07-13 NOTE — Assessment & Plan Note (Signed)
 Controlled without meds

## 2024-07-13 NOTE — Patient Instructions (Addendum)
 Stop by the lab prior to leaving today. I will notify you of your results once received.   You will either be contacted via phone regarding your referral to gynecology , or you may receive a letter on your MyChart portal from our referral team with instructions for scheduling an appointment. Please let us  know if you have not been contacted by anyone within two weeks.  Continue medications as prescribed.   Let pharmacy know when you need refills.   Follow up in HTN.  It was a pleasure to see you today!

## 2024-07-13 NOTE — Assessment & Plan Note (Signed)
 Controlled.  Continue Amlodipine  10 mg once daily, labetalol  300 mg TID and Lisinopril  20 mg once daily.   BMP pending.

## 2024-07-13 NOTE — Assessment & Plan Note (Addendum)
 Immunizations  Pap smear due Mammogram due, orders placed.   Discussed the importance of a healthy diet and regular exercise in order for weight loss, and to reduce the risk of further co-morbidity.  Exam stable. Labs reviewed.  Follow up in 1 year for repeat physical.

## 2024-07-13 NOTE — Assessment & Plan Note (Signed)
 A1c in July 2025.  Controlled.

## 2024-07-14 ENCOUNTER — Ambulatory Visit: Payer: Self-pay | Admitting: General Practice

## 2024-07-14 ENCOUNTER — Encounter

## 2024-07-14 ENCOUNTER — Ambulatory Visit

## 2024-07-14 DIAGNOSIS — D508 Other iron deficiency anemias: Secondary | ICD-10-CM

## 2024-07-14 LAB — CBC
HCT: 31 % — ABNORMAL LOW (ref 36.0–46.0)
Hemoglobin: 10.5 g/dL — ABNORMAL LOW (ref 12.0–15.0)
MCHC: 33.7 g/dL (ref 30.0–36.0)
MCV: 91.4 fl (ref 78.0–100.0)
Platelets: 259 K/uL (ref 150.0–400.0)
RBC: 3.39 Mil/uL — ABNORMAL LOW (ref 3.87–5.11)
RDW: 19.6 % — ABNORMAL HIGH (ref 11.5–15.5)
WBC: 4.1 K/uL (ref 4.0–10.5)

## 2024-07-14 LAB — IRON, TOTAL/TOTAL IRON BINDING CAP
%SAT: 11 % — ABNORMAL LOW (ref 16–45)
Iron: 51 ug/dL (ref 40–190)
TIBC: 465 ug/dL — ABNORMAL HIGH (ref 250–450)

## 2024-07-14 LAB — BASIC METABOLIC PANEL WITH GFR
BUN: 16 mg/dL (ref 6–23)
CO2: 26 meq/L (ref 19–32)
Calcium: 9.3 mg/dL (ref 8.4–10.5)
Chloride: 100 meq/L (ref 96–112)
Creatinine, Ser: 0.82 mg/dL (ref 0.40–1.20)
GFR: 89.09 mL/min (ref >60.00)
Glucose, Bld: 88 mg/dL (ref 70–99)
Potassium: 3.7 meq/L (ref 3.5–5.1)
Sodium: 136 meq/L (ref 135–145)

## 2024-07-14 LAB — TSH: TSH: 1.21 u[IU]/mL (ref 0.35–5.50)

## 2024-07-14 LAB — HEPATITIS C ANTIBODY: Hepatitis C Ab: NONREACTIVE

## 2024-07-15 MED ORDER — LABETALOL HCL 300 MG PO TABS: 300.0000 mg | ORAL_TABLET | Freq: Three times a day (TID) | ORAL | 1 refills | Status: AC

## 2024-07-19 ENCOUNTER — Ambulatory Visit

## 2024-07-19 ENCOUNTER — Encounter

## 2024-07-21 ENCOUNTER — Ambulatory Visit

## 2024-07-21 ENCOUNTER — Encounter

## 2024-07-26 ENCOUNTER — Ambulatory Visit

## 2024-07-26 ENCOUNTER — Encounter

## 2024-07-28 ENCOUNTER — Encounter

## 2024-07-28 ENCOUNTER — Ambulatory Visit

## 2024-08-02 ENCOUNTER — Encounter

## 2024-08-02 ENCOUNTER — Ambulatory Visit

## 2024-08-04 ENCOUNTER — Ambulatory Visit: Admitting: Physical Therapy

## 2024-08-04 ENCOUNTER — Encounter

## 2024-08-09 ENCOUNTER — Encounter

## 2024-08-09 ENCOUNTER — Ambulatory Visit

## 2024-08-11 ENCOUNTER — Encounter

## 2024-08-11 ENCOUNTER — Ambulatory Visit: Admitting: Physical Therapy

## 2024-08-16 ENCOUNTER — Other Ambulatory Visit: Payer: Self-pay | Admitting: General Practice

## 2024-08-16 ENCOUNTER — Encounter

## 2024-08-16 ENCOUNTER — Ambulatory Visit

## 2024-08-23 ENCOUNTER — Ambulatory Visit

## 2024-08-23 ENCOUNTER — Encounter

## 2024-08-25 ENCOUNTER — Encounter

## 2024-08-25 ENCOUNTER — Ambulatory Visit

## 2024-08-30 ENCOUNTER — Encounter

## 2024-08-30 ENCOUNTER — Ambulatory Visit

## 2024-09-01 ENCOUNTER — Ambulatory Visit

## 2024-09-01 ENCOUNTER — Encounter

## 2024-09-06 ENCOUNTER — Ambulatory Visit

## 2024-09-06 ENCOUNTER — Encounter

## 2024-09-08 ENCOUNTER — Ambulatory Visit

## 2024-09-08 ENCOUNTER — Encounter

## 2024-09-13 ENCOUNTER — Encounter

## 2024-09-13 ENCOUNTER — Ambulatory Visit

## 2024-09-20 ENCOUNTER — Ambulatory Visit

## 2024-09-20 ENCOUNTER — Encounter

## 2024-09-27 ENCOUNTER — Ambulatory Visit

## 2024-09-27 ENCOUNTER — Encounter

## 2024-09-29 ENCOUNTER — Ambulatory Visit

## 2024-09-29 ENCOUNTER — Encounter

## 2024-10-04 ENCOUNTER — Encounter

## 2024-10-04 ENCOUNTER — Ambulatory Visit

## 2025-01-11 ENCOUNTER — Ambulatory Visit: Admitting: General Practice
